# Patient Record
Sex: Female | Born: 1975
Health system: Southern US, Community
[De-identification: ages and names within clinical notes are randomized; demographics above are authoritative.]

## PROBLEM LIST (undated history)

## (undated) DIAGNOSIS — Z9981 Dependence on supplemental oxygen: Secondary | ICD-10-CM

## (undated) DIAGNOSIS — B379 Candidiasis, unspecified: Secondary | ICD-10-CM

## (undated) DIAGNOSIS — I499 Cardiac arrhythmia, unspecified: Secondary | ICD-10-CM

## (undated) DIAGNOSIS — G4733 Obstructive sleep apnea (adult) (pediatric): Secondary | ICD-10-CM

## (undated) DIAGNOSIS — I890 Lymphedema, not elsewhere classified: Secondary | ICD-10-CM

## (undated) DIAGNOSIS — R112 Nausea with vomiting, unspecified: Secondary | ICD-10-CM

## (undated) DIAGNOSIS — L0390 Cellulitis, unspecified: Secondary | ICD-10-CM

## (undated) DIAGNOSIS — Z9889 Other specified postprocedural states: Secondary | ICD-10-CM

## (undated) HISTORY — PX: DILATION AND CURETTAGE OF UTERUS: SHX78

---

## 2004-12-15 HISTORY — PX: HYSTEROSCOPY WITH D & C: SHX1775

## 2011-03-26 ENCOUNTER — Encounter: Payer: Managed Care, Other (non HMO) | Attending: Family Medicine | Admitting: *Deleted

## 2011-03-26 DIAGNOSIS — Z713 Dietary counseling and surveillance: Secondary | ICD-10-CM | POA: Insufficient documentation

## 2011-07-26 ENCOUNTER — Inpatient Hospital Stay (HOSPITAL_COMMUNITY)
Admission: EM | Admit: 2011-07-26 | Discharge: 2011-07-30 | DRG: 603 | Disposition: A | Discharge status: Home / Self Care | Payer: Managed Care, Other (non HMO) | Attending: Internal Medicine | Admitting: Internal Medicine

## 2011-07-26 ENCOUNTER — Emergency Department (HOSPITAL_COMMUNITY): Payer: Managed Care, Other (non HMO)

## 2011-07-26 DIAGNOSIS — I498 Other specified cardiac arrhythmias: Secondary | ICD-10-CM | POA: Diagnosis present

## 2011-07-26 DIAGNOSIS — G4733 Obstructive sleep apnea (adult) (pediatric): Secondary | ICD-10-CM | POA: Diagnosis present

## 2011-07-26 DIAGNOSIS — E559 Vitamin D deficiency, unspecified: Secondary | ICD-10-CM | POA: Diagnosis present

## 2011-07-26 DIAGNOSIS — E876 Hypokalemia: Secondary | ICD-10-CM | POA: Diagnosis not present

## 2011-07-26 DIAGNOSIS — L02419 Cutaneous abscess of limb, unspecified: Principal | ICD-10-CM | POA: Diagnosis present

## 2011-07-26 DIAGNOSIS — Z6841 Body Mass Index (BMI) 40.0 and over, adult: Secondary | ICD-10-CM

## 2011-07-26 DIAGNOSIS — J309 Allergic rhinitis, unspecified: Secondary | ICD-10-CM | POA: Diagnosis present

## 2011-07-26 DIAGNOSIS — I890 Lymphedema, not elsewhere classified: Secondary | ICD-10-CM | POA: Diagnosis present

## 2011-07-26 DIAGNOSIS — L03119 Cellulitis of unspecified part of limb: Principal | ICD-10-CM | POA: Diagnosis present

## 2011-07-26 LAB — BASIC METABOLIC PANEL
BUN: 9 mg/dL (ref 6–23)
CO2: 26 mEq/L (ref 19–32)
Calcium: 9.6 mg/dL (ref 8.4–10.5)
Chloride: 98 mEq/L (ref 96–112)
Creatinine, Ser: 0.57 mg/dL (ref 0.50–1.10)
GFR calc Af Amer: >60 mL/min (ref >60)
GFR calc non Af Amer: >60 mL/min (ref >60)
Glucose, Bld: 121 mg/dL — ABNORMAL HIGH (ref 70–99)
Potassium: 3.9 mEq/L (ref 3.5–5.1)
Sodium: 136 mEq/L (ref 135–145)

## 2011-07-26 LAB — CBC
HCT: 36.5 % (ref 36.0–46.0)
Hemoglobin: 12 g/dL (ref 12.0–15.0)
MCH: 26.7 pg (ref 26.0–34.0)
MCHC: 32.9 g/dL (ref 30.0–36.0)
MCV: 81.3 fL (ref 78.0–100.0)
Platelets: 254 10*3/uL (ref 150–400)
RBC: 4.49 MIL/uL (ref 3.87–5.11)
RDW: 16.6 % — ABNORMAL HIGH (ref 11.5–15.5)
WBC: 19.8 10*3/uL — ABNORMAL HIGH (ref 4.0–10.5)

## 2011-07-26 LAB — DIFFERENTIAL
Basophils Absolute: 0 10*3/uL (ref 0.0–0.1)
Basophils Relative: 0 % (ref 0–1)
Monocytes Absolute: 0.6 10*3/uL (ref 0.1–1.0)
Neutro Abs: 18.8 10*3/uL — ABNORMAL HIGH (ref 1.7–7.7)
Neutrophils Relative %: 95 % — ABNORMAL HIGH (ref 43–77)

## 2011-07-27 LAB — COMPREHENSIVE METABOLIC PANEL
ALT: 20 U/L (ref 0–35)
AST: 19 U/L (ref 0–37)
CO2: 25 mEq/L (ref 19–32)
Chloride: 100 mEq/L (ref 96–112)
GFR calc non Af Amer: >60 mL/min (ref >60)
Sodium: 135 mEq/L (ref 135–145)
Total Bilirubin: 0.6 mg/dL (ref 0.3–1.2)

## 2011-07-27 LAB — CBC
Platelets: 252 10*3/uL (ref 150–400)
RBC: 4.34 MIL/uL (ref 3.87–5.11)
WBC: 19.2 10*3/uL — ABNORMAL HIGH (ref 4.0–10.5)

## 2011-07-28 DIAGNOSIS — M79609 Pain in unspecified limb: Secondary | ICD-10-CM

## 2011-07-28 LAB — CBC
MCH: 25.8 pg — ABNORMAL LOW (ref 26.0–34.0)
MCHC: 31.8 g/dL (ref 30.0–36.0)
Platelets: 200 10*3/uL (ref 150–400)

## 2011-07-28 LAB — BASIC METABOLIC PANEL
Calcium: 8.6 mg/dL (ref 8.4–10.5)
GFR calc non Af Amer: >60 mL/min (ref >60)
Glucose, Bld: 116 mg/dL — ABNORMAL HIGH (ref 70–99)
Sodium: 135 mEq/L (ref 135–145)

## 2011-07-29 LAB — BASIC METABOLIC PANEL
BUN: 8 mg/dL (ref 6–23)
CO2: 24 mEq/L (ref 19–32)
Chloride: 104 mEq/L (ref 96–112)
Creatinine, Ser: 0.79 mg/dL (ref 0.50–1.10)
GFR calc Af Amer: >60 mL/min (ref >60)
Glucose, Bld: 119 mg/dL — ABNORMAL HIGH (ref 70–99)

## 2011-07-29 LAB — CBC
HCT: 32.4 % — ABNORMAL LOW (ref 36.0–46.0)
Hemoglobin: 10.2 g/dL — ABNORMAL LOW (ref 12.0–15.0)
MCV: 80.8 fL (ref 78.0–100.0)
RDW: 17.1 % — ABNORMAL HIGH (ref 11.5–15.5)
WBC: 6.5 10*3/uL (ref 4.0–10.5)

## 2011-08-02 LAB — CULTURE, BLOOD (ROUTINE X 2)

## 2011-08-03 NOTE — Discharge Summary (Signed)
  Amber, Kline        ACCOUNT NO.:  [id number]  MEDICAL RECORD NO.:  [id number]  LOCATION:  3702                         FACILITY:  [facility]  PHYSICIAN:  Conley Canal, MD      DATE OF BIRTH:  1976-12-13  DATE OF ADMISSION:  07/26/2011 DATE OF DISCHARGE:  07/30/2011                        DISCHARGE SUMMARY - REFERRING   PRIMARY CARE PHYSICIAN:  Dr. Arlys John.  DISCHARGE DIAGNOSES: 1. Severe right leg cellulitis in patient with chronic lymph edema. 2. Morbid obesity, BMI greater than 40. 3. Obstructive sleep apnea on CPAP. 4. Vitamin D deficiency. 5. Previous history of cellulitis. 6. Seasonal allergies.  DISCHARGE MEDICATIONS: 1. Doxycycline 100 mg twice daily for 6 more days. 2. Keflex 500 mg twice daily for 7 more days. 3. Vitamin D2 50,000 units every Sunday.  PROCEDURES PERFORMED:  X-rays of the tibial, femur, and hip on August 11 with no acute findings.  HOSPITAL COURSE:  [name] was admitted on August 11 with complaints of right leg pain and swelling.  The patient was found to have severe right leg cellulitis.  On the day of admission her white count was 19,000 with 95% neutrophils and a sedimentation rate was 35.  The patient was started on broad-spectrum antibiotics including Zosyn and vancomycin with improvement in a white count 6.5. Prior to discharge her electrolytes showed some hypokalemia, which was corrected.  She has done well.  She should complete another 7 days of antibiotics and follow with her primary care physician in the next 1 week.  She is discharged in stable condition.  The time spent for this discharge preparation is less than 30 minutes.     Conley Canal, MD     SR/MEDQ  D:  07/30/2011  T:  07/30/2011  Job:  [id number]  cc:   Dr. Arlys John  Electronically Signed by Conley Canal  on 08/03/2011 01:34:36 PM

## 2011-09-27 NOTE — H&P (Signed)
Amber Kline, Amber Kline        ACCOUNT NO.:  [id number]  MEDICAL RECORD NO.:  [id number]  LOCATION:  5023                         FACILITY:  [facility]  PHYSICIAN:  Lonia Blood, M.D.      DATE OF BIRTH:  Nov 07, 1976  DATE OF ADMISSION:  07/26/2011 DATE OF DISCHARGE:                             HISTORY & PHYSICAL   PRIMARY CARE PHYSICIAN:  She is unassigned to [place].  PRESENTING COMPLAINT:  Right leg pain and swelling.  HISTORY OF PRESENT ILLNESS:  The patient is a 35 year old morbidly obese female with known history of sleep apnea as well as prior cellulitis in her left leg.  She had cellulitis over 2 years ago in her left lower extremity, which seemed to have flared in April of this year and was treated.  She started having pain and swelling on her right leg which has turned red.  She came to the emergency room where she was seen and evaluated, seems to have a recurrence of cellulitis, this time on the right leg.  She has some fever at home, but denied diarrhea.  No constipation.  No nausea, vomiting or diarrhea.  PAST MEDICAL HISTORY:  Includes cellulitis, sleep apnea, morbid obesity, vitamin D deficiency.  ALLERGIES:  SEASONALE and SULFA.  MEDICATIONS:  Vitamin D2 50,000 units 1 tablet weekly.  SOCIAL HISTORY:  She is married and lives here in [place] with her husband.  She denied tobacco, alcohol or IV drug use.  FAMILY HISTORY:  No significant family history as far as she knows.  REVIEW OF SYSTEMS:  All systems reviewed are negative except per HPI.  PHYSICAL EXAMINATION:  VITAL SIGNS:  Temperature 98.7, her blood pressure 117/66, pulse 119, respiratory rate 18, sats 98% room air. GENERAL:  She is awake, alert, oriented, morbidly obese female, who is in no acute distress. HEENT:  PERRL.  EOMI.  No pallor, no jaundice.  No rhinorrhea. NECK:  Supple.  No visible JVD, no lymphadenopathy. RESPIRATORY:  Shows good air entry bilaterally.  No wheezes, no rales, no  crackles. CARDIOVASCULAR:  Shows S1, S2.  No murmur. ABDOMEN:  Obese, soft, nontender with positive bowel sounds. EXTREMITIES:  Right lower extremity is swollen, tender with some chronic stasis dermatitis.  It is red all the way to upper part of the thigh. Warm to touch. MUSCULOSKELETAL:  No joint swelling or tenderness.  LABORATORY DATA:  Sodium 136, potassium 3.9, chloride 98, CO2 26, glucose 121, BUN 9, creatinine 0.57, calcium 9.6.  White count is 19.8 with hemoglobin of 12, platelet count is 254 and left shift ANC of 18.8, sed rate is 35.  Right hip x-ray showed no acute bony abnormalities.  X- ray of the right femur showed no acute bony abnormalities.  X-ray of the right tibia and fibula also showed no acute bony abnormalities.  ASSESSMENT:  This is a 35 year old female presenting with right lower extremity pain, swelling, tenderness, pain is rated as 8/10.  She is currently stable and seems to have cellulitis.  PLAN: 1. Right lower extremity cellulitis, probably staph.  We cannot     completely rule out MRSA.  We will therefore admit the patient for     IV antibiotics.  I will start her on  vancomycin IV and transition     her to oral doxycycline or Septra.  We will get blood cultures, and     if we get any positive cultures, we will narrow down antibiotics. 2. Obstructive sleep apnea.  She is on BiPAP at home and we will     continue with that. 3. Morbid obesity.  The patient has been counseled and she probably     some Nutrition counseling. 4. Vitamin D deficiency.  She will continue on her weekly doses of     vitamin D. 5. Sinus tachycardia, probably from her infection.  We will keep a     watch on her, and if this persists, we will put her on tele     monitoring.  Probably with IV fluids, the patient should do much     better.     Lonia Blood, M.D.     Verlin Grills  D:  07/27/2011  T:  07/27/2011  Job:  [id number]  Electronically Signed by Lonia Blood M.D. on 09/27/2011  02:52:41 PM

## 2012-07-30 ENCOUNTER — Inpatient Hospital Stay (HOSPITAL_COMMUNITY)
Admission: EM | Admit: 2012-07-30 | Discharge: 2012-08-01 | DRG: 603 | Disposition: A | Discharge status: Home / Self Care | Payer: Managed Care, Other (non HMO) | Attending: Internal Medicine | Admitting: Internal Medicine

## 2012-07-30 ENCOUNTER — Encounter (HOSPITAL_COMMUNITY): Payer: Self-pay

## 2012-07-30 DIAGNOSIS — E46 Unspecified protein-calorie malnutrition: Secondary | ICD-10-CM | POA: Diagnosis present

## 2012-07-30 DIAGNOSIS — Z833 Family history of diabetes mellitus: Secondary | ICD-10-CM

## 2012-07-30 DIAGNOSIS — R000 Tachycardia, unspecified: Secondary | ICD-10-CM | POA: Diagnosis present

## 2012-07-30 DIAGNOSIS — R509 Fever, unspecified: Secondary | ICD-10-CM | POA: Diagnosis present

## 2012-07-30 DIAGNOSIS — Z9981 Dependence on supplemental oxygen: Secondary | ICD-10-CM

## 2012-07-30 DIAGNOSIS — R42 Dizziness and giddiness: Secondary | ICD-10-CM | POA: Diagnosis present

## 2012-07-30 DIAGNOSIS — R51 Headache: Secondary | ICD-10-CM | POA: Diagnosis not present

## 2012-07-30 DIAGNOSIS — L02419 Cutaneous abscess of limb, unspecified: Principal | ICD-10-CM | POA: Diagnosis present

## 2012-07-30 DIAGNOSIS — L0390 Cellulitis, unspecified: Secondary | ICD-10-CM

## 2012-07-30 DIAGNOSIS — R319 Hematuria, unspecified: Secondary | ICD-10-CM | POA: Diagnosis present

## 2012-07-30 DIAGNOSIS — R739 Hyperglycemia, unspecified: Secondary | ICD-10-CM | POA: Diagnosis present

## 2012-07-30 DIAGNOSIS — G4733 Obstructive sleep apnea (adult) (pediatric): Secondary | ICD-10-CM | POA: Diagnosis present

## 2012-07-30 DIAGNOSIS — R7309 Other abnormal glucose: Secondary | ICD-10-CM | POA: Diagnosis present

## 2012-07-30 DIAGNOSIS — E876 Hypokalemia: Secondary | ICD-10-CM | POA: Diagnosis present

## 2012-07-30 DIAGNOSIS — Z6841 Body Mass Index (BMI) 40.0 and over, adult: Secondary | ICD-10-CM

## 2012-07-30 DIAGNOSIS — I499 Cardiac arrhythmia, unspecified: Secondary | ICD-10-CM

## 2012-07-30 DIAGNOSIS — N926 Irregular menstruation, unspecified: Secondary | ICD-10-CM | POA: Diagnosis present

## 2012-07-30 DIAGNOSIS — D509 Iron deficiency anemia, unspecified: Secondary | ICD-10-CM | POA: Diagnosis present

## 2012-07-30 DIAGNOSIS — R519 Headache, unspecified: Secondary | ICD-10-CM | POA: Diagnosis not present

## 2012-07-30 DIAGNOSIS — L03119 Cellulitis of unspecified part of limb: Secondary | ICD-10-CM | POA: Diagnosis present

## 2012-07-30 DIAGNOSIS — I890 Lymphedema, not elsewhere classified: Secondary | ICD-10-CM | POA: Diagnosis present

## 2012-07-30 DIAGNOSIS — D72829 Elevated white blood cell count, unspecified: Secondary | ICD-10-CM | POA: Diagnosis present

## 2012-07-30 HISTORY — DX: Dependence on supplemental oxygen: Z99.81

## 2012-07-30 HISTORY — DX: Obstructive sleep apnea (adult) (pediatric): G47.33

## 2012-07-30 HISTORY — DX: Cardiac arrhythmia, unspecified: I49.9

## 2012-07-30 HISTORY — DX: Other specified postprocedural states: Z98.890

## 2012-07-30 HISTORY — DX: Cellulitis, unspecified: L03.90

## 2012-07-30 HISTORY — DX: Nausea with vomiting, unspecified: R11.2

## 2012-07-30 LAB — URINALYSIS, ROUTINE W REFLEX MICROSCOPIC
Bilirubin Urine: NEGATIVE
Glucose, UA: NEGATIVE mg/dL
Specific Gravity, Urine: 1.012 (ref 1.005–1.030)

## 2012-07-30 LAB — CBC WITH DIFFERENTIAL/PLATELET
Basophils Absolute: 0 10*3/uL (ref 0.0–0.1)
Basophils Relative: 0 % (ref 0–1)
Eosinophils Absolute: 0 10*3/uL (ref 0.0–0.7)
Eosinophils Relative: 0 % (ref 0–5)
HCT: 39.1 % (ref 36.0–46.0)
MCH: 25.7 pg — ABNORMAL LOW (ref 26.0–34.0)
MCHC: 32.2 g/dL (ref 30.0–36.0)
MCV: 79.6 fL (ref 78.0–100.0)
Monocytes Absolute: 0.7 10*3/uL (ref 0.1–1.0)
Monocytes Relative: 3 % (ref 3–12)
RDW: 16.1 % — ABNORMAL HIGH (ref 11.5–15.5)

## 2012-07-30 LAB — POCT I-STAT, CHEM 8
BUN: 13 mg/dL (ref 6–23)
Chloride: 100 mEq/L (ref 96–112)
Creatinine, Ser: 0.9 mg/dL (ref 0.50–1.10)
Glucose, Bld: 119 mg/dL — ABNORMAL HIGH (ref 70–99)
Potassium: 3.9 mEq/L (ref 3.5–5.1)
Sodium: 135 mEq/L (ref 135–145)

## 2012-07-30 LAB — URINE MICROSCOPIC-ADD ON

## 2012-07-30 MED ORDER — SODIUM CHLORIDE 0.9 % IV BOLUS (SEPSIS)
1000.0000 mL | Freq: Once | INTRAVENOUS | Status: AC
  Administered 2012-07-30: 1000 mL via INTRAVENOUS

## 2012-07-30 MED ORDER — PIPERACILLIN-TAZOBACTAM 3.375 G IVPB
3.3750 g | Freq: Once | INTRAVENOUS | Status: AC
  Administered 2012-07-30: 3.375 g via INTRAVENOUS
  Filled 2012-07-30 (×2): qty 50

## 2012-07-30 MED ORDER — VANCOMYCIN HCL 1000 MG IV SOLR
1500.0000 mg | Freq: Two times a day (BID) | INTRAVENOUS | Status: DC
  Administered 2012-07-31 – 2012-08-01 (×3): 1500 mg via INTRAVENOUS
  Filled 2012-07-30 (×5): qty 1500

## 2012-07-30 MED ORDER — ACETAMINOPHEN 325 MG PO TABS
ORAL_TABLET | ORAL | Status: AC
  Filled 2012-07-30: qty 2

## 2012-07-30 MED ORDER — HYDROCODONE-ACETAMINOPHEN 5-325 MG PO TABS
1.0000 | ORAL_TABLET | ORAL | Status: DC | PRN
Start: 2012-07-30 — End: 2012-07-30

## 2012-07-30 MED ORDER — ACETAMINOPHEN 325 MG PO TABS
325.0000 mg | ORAL_TABLET | Freq: Once | ORAL | Status: AC
  Administered 2012-07-30: 325 mg via ORAL

## 2012-07-30 MED ORDER — ENOXAPARIN SODIUM 40 MG/0.4ML ~~LOC~~ SOLN
40.0000 mg | Freq: Every day | SUBCUTANEOUS | Status: DC
  Administered 2012-07-30: 40 mg via SUBCUTANEOUS
  Filled 2012-07-30 (×2): qty 0.4

## 2012-07-30 MED ORDER — ACETAMINOPHEN 325 MG PO TABS
650.0000 mg | ORAL_TABLET | Freq: Four times a day (QID) | ORAL | Status: DC | PRN
  Administered 2012-07-31: 650 mg via ORAL
  Filled 2012-07-30: qty 2
  Filled 2012-07-30: qty 1

## 2012-07-30 MED ORDER — ONDANSETRON HCL 4 MG/2ML IJ SOLN: 4.0000 mg | Freq: Three times a day (TID) | INTRAMUSCULAR | Status: AC | PRN

## 2012-07-30 MED ORDER — OXYCODONE-ACETAMINOPHEN 5-325 MG PO TABS: 1.0000 | ORAL_TABLET | ORAL | Status: DC | PRN

## 2012-07-30 MED ORDER — FAMOTIDINE 40 MG PO TABS
40.0000 mg | ORAL_TABLET | Freq: Every day | ORAL | Status: DC
  Administered 2012-07-30 – 2012-07-31 (×2): 40 mg via ORAL
  Filled 2012-07-30 (×3): qty 1

## 2012-07-30 MED ORDER — SODIUM CHLORIDE 0.9 % IV SOLN
INTRAVENOUS | Status: DC
  Administered 2012-07-30: 15:00:00 via INTRAVENOUS

## 2012-07-30 MED ORDER — VANCOMYCIN HCL 1000 MG IV SOLR
1500.0000 mg | INTRAVENOUS | Status: AC
  Administered 2012-07-30: 1500 mg via INTRAVENOUS
  Filled 2012-07-30: qty 1500

## 2012-07-30 MED ORDER — ONDANSETRON HCL 4 MG PO TABS: 4.0000 mg | ORAL_TABLET | Freq: Four times a day (QID) | ORAL | Status: DC | PRN

## 2012-07-30 MED ORDER — SODIUM CHLORIDE 0.9 % IV SOLN
INTRAVENOUS | Status: DC
  Administered 2012-07-30 – 2012-07-31 (×2): via INTRAVENOUS

## 2012-07-30 MED ORDER — ACETAMINOPHEN 325 MG PO TABS
650.0000 mg | ORAL_TABLET | Freq: Once | ORAL | Status: AC
  Administered 2012-07-30: 650 mg via ORAL

## 2012-07-30 MED ORDER — VANCOMYCIN HCL IN DEXTROSE 1-5 GM/200ML-% IV SOLN
1000.0000 mg | Freq: Once | INTRAVENOUS | Status: AC
  Administered 2012-07-30: 1000 mg via INTRAVENOUS
  Filled 2012-07-30: qty 200

## 2012-07-30 MED ORDER — ONDANSETRON HCL 4 MG/2ML IJ SOLN: 4.0000 mg | Freq: Four times a day (QID) | INTRAMUSCULAR | Status: DC | PRN

## 2012-07-30 MED ORDER — HYDROMORPHONE HCL PF 1 MG/ML IJ SOLN: 1.0000 mg | INTRAMUSCULAR | Status: DC | PRN

## 2012-07-30 NOTE — ED Notes (Signed)
Wound culture collected by resident in CDU.

## 2012-07-30 NOTE — ED Notes (Signed)
Pt complains of cellulitis to right lower leg, hx of same. This episode strarted yesterdayafternoon.

## 2012-07-30 NOTE — ED Notes (Signed)
Patient unable to void at this time

## 2012-07-30 NOTE — ED Provider Notes (Signed)
History   This chart was scribed for Amber Kline B. Bernette Mayers, MD by Melba Coon. The patient was seen in room TR10C/TR10C and the patient's care was started at 1:47PM.    CSN: [id number]  Arrival date & time 07/30/12  1327   First MD Initiated Contact with Patient 07/30/12 1339      Chief Complaint  Patient presents with  . Cellulitis    (Consider location/radiation/quality/duration/timing/severity/associated sxs/prior treatment) The history is provided by the patient. No language interpreter was used.   Amber Kline is a 36 y.o. female who presents to the Emergency Department complaining of persistent, moderate to severe erythema to the right lower leg with an onset yesterday. Pt states that she has a Hx of similar symptoms last year; was admitted into the hospital for 4 days and, when released, was out of work for 3 weeks before the rash cleared up. Vancomycin and zosyn were given while in the hospital which alleviated the symptoms; pt was Rx Keflex after d/c which alleviated the symptoms. Fever and warmth to affected area present. No drainage present. No HA, neck pain, sore throat, rash, back pain, CP, SOB, abd pain, n/v/d, dysuria, or extremity edema, weakness, numbness, or tingling. No known allergies. No other pertinent medical symptoms.  PCP: [facility]; used to be Dr. Luz Brazen   Past Medical History  Diagnosis Date  . Cellulitis   . Sleep apnea     No past surgical history on file.  No family history on file.  History  Substance Use Topics  . Smoking status: Never Smoker   . Smokeless tobacco: Not on file  . Alcohol Use: No    OB History    Grav Para Term Preterm Abortions TAB SAB Ect Mult Living                  Review of Systems 10 Systems reviewed and all are negative for acute change except as noted in the HPI.   Allergies  Review of patient's allergies indicates no known allergies.  Home Medications  No current outpatient prescriptions on  file.  BP 139/105  Pulse 131  Temp 101 F (38.3 C) (Oral)  Resp 18  SpO2 99%  Physical Exam  Nursing note and vitals reviewed. Constitutional: She is oriented to person, place, and time. She appears well-developed and well-nourished.       Morbidly obese.  HENT:  Head: Normocephalic and atraumatic.  Eyes: EOM are normal. Pupils are equal, round, and reactive to light.  Neck: Normal range of motion. Neck supple.  Cardiovascular: Normal rate, normal heart sounds and intact distal pulses.   Pulmonary/Chest: Effort normal and breath sounds normal.  Abdominal: Bowel sounds are normal. She exhibits no distension. There is no tenderness.  Musculoskeletal: Normal range of motion. She exhibits no edema and no tenderness.  Neurological: She is alert and oriented to person, place, and time. She has normal strength. No cranial nerve deficit or sensory deficit.  Skin: Skin is warm and dry. No rash noted. There is erythema (erythema with qarmth on right medial leg from ankle to thigh).  Psychiatric: She has a normal mood and affect.    ED Course  Procedures (including critical care time)  DIAGNOSTIC STUDIES: Oxygen Saturation is 99% on room air, normal by my interpretation.    COORDINATION OF CARE:  1:52PM - pt will be given abx; advised to be admitted into hospital. Vanc, zosyn, tylenol, IV fludis will be ordered for the pt along with blood w/u.  Labs Reviewed  CBC WITH DIFFERENTIAL   No results found.   No diagnosis found.    MDM  Extensive cellulitis to R leg, associated with fever and tachycardia. Will move to CDU pending labs, plan admission for IV abx. Doubt she will be improved enough for discharge in 23hrs so not a good candidate for CDU Observation protocol.  Discussed with Fayrene Helper, PA in CDU.   I personally performed the services described in the documentation, which were scribed in my presence. The recorded information has been reviewed and considered.           Babette Stum B. Bernette Mayers, MD 07/30/12 1401

## 2012-07-30 NOTE — Progress Notes (Signed)
Patient placed on autoset CPAP at 2250 with a 2L oxygen bleed in per home regimen. Tolerating well at this time. SpO2 98% RT will continue to monitor.

## 2012-07-30 NOTE — Progress Notes (Signed)
ANTIBIOTIC CONSULT NOTE - INITIAL  Pharmacy Consult for Vancomycin Indication: cellulitis  No Known Allergies  Patient Measurements:   Weight ~ 430 lbs Height ~ 63 inches  Vital Signs: Temp: 101.1 F (38.4 C) (08/16 1610) Temp src: Axillary (08/16 1610) BP: 133/49 mmHg (08/16 1610) Pulse Rate: 106  (08/16 1500) Intake/Output from previous day:   Intake/Output from this shift:    Labs:  Basename 07/30/12 1429 07/30/12 1356  WBC -- 21.9*  HGB 12.9 12.6  PLT -- 256  LABCREA -- --  CREATININE 0.90 --   CrCl >100 ml/min  Microbiology: No results found for this or any previous visit (from the past 720 hour(s)).  Medical History: Past Medical History  Diagnosis Date  . Cellulitis   . Sleep apnea     Medications:  No prescriptions prior to admission   Received Vancomycin 1gm IV x 1 dose in ED today ~ 1500.   Assessment: 36 yo F presents to ED 07/30/2012 with RLE swelling and redness x 1 day.  Pt also reports pain and purulent drainage on her leg.  Pt has history of cellulitis RLE as well.  WBC and temp elevated, SCr WNL.  Obesity noted - will use obesity dosing nomogram.  Goal of Therapy:  Vancomycin trough level 10-15 mcg/ml  Plan:  Vancomycin 1500 mg IV x 1 now (to complete 2500 mg IV loading dose). Vancomycin 1500 mg IV Q12h - next dose due ~ 0600 07/31/12.   Will monitor renal function, culture data, and clinical progress. Check Vancomycin trough level when/if indicated.   [employer], Pharm.D., BCPS Clinical Pharmacist Pager [phone] 07/30/2012 4:30 PM

## 2012-07-30 NOTE — ED Notes (Signed)
Cellulitic area marked dated and timed.

## 2012-07-30 NOTE — H&P (Signed)
Hospital Admission Note Date: 07/30/2012  Patient name: Amber Kline Medical record number: [id number] Date of birth: 1976/10/13 Age: 36 y.o. Gender: female PCP: DEFAULT,PROVIDER, MD  Medical Service: Internal medicine teaching service  Attending physician: Dr. Margarito Liner    1st Contact: Dr. Shirlee Latch    Pager: [phone] 2nd Contact: Dr. Tonny Branch    Pager: [phone] After 5 pm or weekends: 1st Contact:      Pager: [phone] 2nd Contact:      Pager: [phone]  Chief Complaint: Right lower leg swelling/redness  History of Present Illness: Amber Kline is a 86 year woman with past medical history of sleep apnea on CPAP at night, lymphedema, cellulitis of her left leg (1 year ago which required 4 days of hospital admission for IV abx) presents to the ED for complaining of right lower extremity swelling and redness that started one day prior to admission. She rated her pain 8/10 in severity. She reports some purulent drainage on her leg. She does have a dog at home but denies any dog or cat bites.  She also reports fever and chills at home however she did not take her temperature but does feel lightheaded.  She also denies any nausea or vomiting, shortness of breath, chest pain.  Meds: No current outpatient prescriptions on file.  Allergies: Allergies as of 07/30/2012  . (No Known Allergies)   Past Medical History  Diagnosis Date  . Cellulitis   . Sleep apnea    Past surgical history: hysteroscopy and D&C for uterus polyps in 2006 . Family history : DM and heart disease in father Social History: she works in Paediatric nurse, married. Has [employer]. Does not smoke, drink alcohol or use illicit drugs  Review of Systems: Constitutional: +fever, + chills, diaphoresis, appetite change and fatigue.  HEENT: Denies photophobia, eye pain, redness, hearing loss, ear pain, congestion, sore throat, rhinorrhea, sneezing, mouth sores, trouble swallowing, neck pain, neck stiffness and  tinnitus.  Respiratory: Denies SOB, DOE, cough, chest tightness, and wheezing.  Cardiovascular: Denies chest pain, palpitations and leg swelling.  Gastrointestinal: Denies nausea, vomiting, abdominal pain, diarrhea, constipation, blood in stool and abdominal distention.  Genitourinary: Denies dysuria, urgency, frequency, hematuria, flank pain and difficulty urinating.  Musculoskeletal: Denies myalgias, back pain, joint swelling, arthralgias and gait problem.  Skin: Denies pallor,+ skin lesion on Right lower extremity.  Neurological: Denies dizziness, seizures, syncope, weakness, light-headedness, numbness and headaches.  Hematological: Denies adenopathy. Easy bruising, personal or family bleeding history  Psychiatric/Behavioral: Denies suicidal ideation, mood changes, confusion, nervousness, sleep disturbance and agitation  Physical Exam: Blood pressure 111/46, pulse 106, temperature 100.3 F (37.9 C), temperature source Oral, resp. rate 20, SpO2 100.00%. General: alert, morbidly obese, and cooperative to examination.  Head: normocephalic and atraumatic.  Eyes: vision grossly intact, pupils equal, pupils round, pupils reactive to light, no injection and anicteric.  Mouth: pharynx pink and moist, no erythema, and no exudates.  Neck: supple, full ROM, no thyromegaly, no JVD, and no carotid bruits.  Lungs: normal respiratory effort, no accessory muscle use, normal breath sounds, no crackles, and no wheezes. Heart: slightly tachycardic, regular rhythm, no murmur, no gallop, and no rub.  Abdomen: obese, soft, non-tender, normal bowel sounds, no distention, no guarding, no rebound tenderness, no hepatomegaly, and no splenomegaly.  Msk: no joint swelling, no joint warmth, and no redness over joints.  Pulses: 2+ DP/PT pulses bilaterally Extremities: No cyanosis, clubbing, nonpitting edema +4 bilaterally (likely lymphadema) Neurologic: alert & oriented X3, cranial nerves II-XII intact, strength  normal  in all extremities, sensation intact to light touch, and gait normal.  Skin: turgor normal.  Right LE: diffused erythema and tenderness and induration with a scattered nodules 0.5cm with scabbing and purulent discharge and as well as fluctuant  (I&D'ed). Psych: Oriented X3, memory intact for recent and remote, normally interactive, good eye contact, not anxious appearing, and not depressed appearing.  Lab results: Basic Metabolic Panel:  Basename 07/30/12 1429  NA 135  K 3.9  CL 100  CO2 --  GLUCOSE 119*  BUN 13  CREATININE 0.90  CALCIUM --  MG --  PHOS --   CBC:  Basename 07/30/12 1429 07/30/12 1356  WBC -- 21.9*  NEUTROABS -- 20.6*  HGB 12.9 12.6  HCT 38.0 39.1  MCV -- 79.6  PLT -- 256    Assessment & Plan by Problem:   1. Cellulitis: Clinical finding is consistent with cellulitis along with an elevated WBC of 21.9 and fever of 102.4 on admission. Patient has received 1 dose of Vancomycin and Zosyn and Tylenol in the ED. -Admit to medsurg floor -Continue IV Vancomycin for empiric treatment for MRSA and will narrow abx choice once we have wound culture results available -I&D performed and will send for wound culture -Send for blood culture -Pain control with percocet, if not controlled, will escalate to Morphine or Dilaudid -Follow CBC  2. Leukocyctosis: 2/2 infection, please see prob #1  3. Fever: 2/2 cellulitis.  Patient received IV abx prior to obtaining blood culture. Will continue IV vancomycin and Tylenol PRN for fever -Will obtain blood culture and urinalysis and awaiting for wound culture results  4. Sleep Apnea: Use CPAP at home, will continue while in-patient  5. Hyperglycemia: Glucose is slightly elevated at 119. Patient does not have a history of diabetes. Will screen with hemoglobin A1c or repeat another random glucose.  6. Tachycardia: likely 2/2 to her acute cellulitis. Will continue to monitor  7. Lightheadedness: unclear etiology, no sign of  bleeding. Hb is stable at 12.9.  Will check orthostatic vitals  DVT ppx: lovenox SQ  Signed: Karl Erway 07/30/2012, 3:54 PM

## 2012-07-31 DIAGNOSIS — L02419 Cutaneous abscess of limb, unspecified: Principal | ICD-10-CM | POA: Diagnosis present

## 2012-07-31 DIAGNOSIS — R519 Headache, unspecified: Secondary | ICD-10-CM | POA: Diagnosis not present

## 2012-07-31 DIAGNOSIS — I890 Lymphedema, not elsewhere classified: Secondary | ICD-10-CM | POA: Diagnosis present

## 2012-07-31 DIAGNOSIS — D509 Iron deficiency anemia, unspecified: Secondary | ICD-10-CM | POA: Diagnosis present

## 2012-07-31 DIAGNOSIS — R7309 Other abnormal glucose: Secondary | ICD-10-CM

## 2012-07-31 DIAGNOSIS — E46 Unspecified protein-calorie malnutrition: Secondary | ICD-10-CM | POA: Diagnosis present

## 2012-07-31 DIAGNOSIS — G4733 Obstructive sleep apnea (adult) (pediatric): Secondary | ICD-10-CM | POA: Diagnosis present

## 2012-07-31 DIAGNOSIS — N926 Irregular menstruation, unspecified: Secondary | ICD-10-CM | POA: Diagnosis present

## 2012-07-31 DIAGNOSIS — R319 Hematuria, unspecified: Secondary | ICD-10-CM | POA: Diagnosis present

## 2012-07-31 DIAGNOSIS — L03119 Cellulitis of unspecified part of limb: Secondary | ICD-10-CM | POA: Diagnosis present

## 2012-07-31 DIAGNOSIS — E876 Hypokalemia: Secondary | ICD-10-CM | POA: Diagnosis present

## 2012-07-31 DIAGNOSIS — R51 Headache: Secondary | ICD-10-CM | POA: Diagnosis not present

## 2012-07-31 LAB — CBC
HCT: 29.9 % — ABNORMAL LOW (ref 36.0–46.0)
Hemoglobin: 9.4 g/dL — ABNORMAL LOW (ref 12.0–15.0)
MCHC: 31.4 g/dL (ref 30.0–36.0)
MCV: 79.1 fL (ref 78.0–100.0)
WBC: 12.2 10*3/uL — ABNORMAL HIGH (ref 4.0–10.5)

## 2012-07-31 LAB — COMPREHENSIVE METABOLIC PANEL
ALT: 13 U/L (ref 0–35)
Albumin: 2.5 g/dL — ABNORMAL LOW (ref 3.5–5.2)
Alkaline Phosphatase: 43 U/L (ref 39–117)
Calcium: 8.2 mg/dL — ABNORMAL LOW (ref 8.4–10.5)
GFR calc Af Amer: >90 mL/min (ref >90)
Glucose, Bld: 112 mg/dL — ABNORMAL HIGH (ref 70–99)
Potassium: 3 mEq/L — ABNORMAL LOW (ref 3.5–5.1)
Sodium: 134 mEq/L — ABNORMAL LOW (ref 135–145)
Total Protein: 6.1 g/dL (ref 6.0–8.3)

## 2012-07-31 LAB — HEMOGLOBIN A1C
Hgb A1c MFr Bld: 5.6 % (ref <5.7)
Mean Plasma Glucose: 114 mg/dL (ref <117)

## 2012-07-31 MED ORDER — POTASSIUM CHLORIDE CRYS ER 20 MEQ PO TBCR
40.0000 meq | EXTENDED_RELEASE_TABLET | Freq: Once | ORAL | Status: AC
  Administered 2012-07-31: 40 meq via ORAL
  Filled 2012-07-31 (×2): qty 2

## 2012-07-31 MED ORDER — ACETAMINOPHEN 325 MG PO TABS: 650.0000 mg | ORAL_TABLET | Freq: Four times a day (QID) | ORAL | Status: DC | PRN

## 2012-07-31 MED ORDER — KETOROLAC TROMETHAMINE 30 MG/ML IJ SOLN
30.0000 mg | Freq: Once | INTRAMUSCULAR | Status: AC
  Administered 2012-07-31: 30 mg via INTRAVENOUS
  Filled 2012-07-31: qty 1

## 2012-07-31 MED ORDER — ENOXAPARIN SODIUM 100 MG/ML ~~LOC~~ SOLN
100.0000 mg | SUBCUTANEOUS | Status: DC
  Administered 2012-07-31: 100 mg via SUBCUTANEOUS
  Filled 2012-07-31 (×2): qty 1

## 2012-07-31 NOTE — H&P (Signed)
Internal Medicine Teaching Service Attending Note Date: 07/31/2012  Patient name: Amber Kline  Medical record number: [id number]  Date of birth: 12-25-1975   I have seen and evaluated [name] and discussed their care with the Residency Team. Please see Dr Christie Nottingham H&P for full details. Ms Leward Quan is a 36 yo female with OSA and obesity. She was hospitalized about one year who with R LE cellulitis and tx with IV Zosyn and Vanc and D/C'd on Doxy and Keflex. She has had two cellulitis episodes in the L leg that require outpt ABX only. Over the course of 24 hrs she rapidly developed pain in her R leg, warmth, swelling, drainage, subjective fever and chills, and lightheadedness. She presented to the emergency room and is found to be febrile with a temperature of 102.4 along with mild tachycardia. She was diagnosed with cellulitis and started on vancomycin Zosyn. The admitting team has continued on the vancomycin.  Past medical history significant only for morbid obesity, chronic lymphedema, history of cellulitis, obstructive sleep apnea, and history of vitamin D deficiency.   She takes no medications nor does she have any allergies.  She works at a desk job doing collections. After her last hospitalization for right lower extremity edema she was given a three-week work leave from her physician because she needed to keep her leg elevated.  Filed Vitals:   07/31/12 0902 07/31/12 0903 07/31/12 0904 07/31/12 1114  BP: 114/55 103/46 117/68 111/66  Pulse: 110   103  Temp: 98.2 F (36.8 C)   98.1 F (36.7 C)  TempSrc: Oral   Oral  Resp: 20   20  Height:      Weight:      SpO2: 100%   95%   GEN : NAD, pleasant HRRR ABD + BS, even in chest, NT, ND EXT : RLE warmth and erythema above knee. No sig improvement overnight. Non-pitting edema. Several firm nodules on L leg - pt states scarring from flea bits. Skin changes of CVI. Scar above L knee from dog bite as kid.  Labs and  imaging reviewed  Assessment and Plan: I agree with the formulated Assessment and Plan with the following changes:   1. RLE cellulitis - agree with Vanc. Temp and WBC show improvement. Once the pt shows some clinical improvement, can narrow abx using cx results if helpful.  2. OSA - cont CPAP 3. Obesity - best addressed in outpt setting with continuity. 4. Dizziness - Pt was orthostatic by pulse only. She has received IVF. Pt feels the dizziness is 2/2 lying in bed.  5. Tachycardia - Liley 2/2 infxn. Pt states this happens when she gets a cellulitis. She has been hydrated. Follow HR.  Burns Spain, MD 8/17/20131:13 PM

## 2012-07-31 NOTE — Plan of Care (Signed)
Problem: Limited Adherence to Nutrition-Related Recommendations (NB-1.6) Goal: Nutrition education Formal process to instruct or train a patient/client in a skill or to impart knowledge to help patients/clients voluntarily manage or modify food choices and eating behavior to maintain or improve health.  Outcome: Completed/Met Date Met:  07/31/12 RD consulted for diet education, pt is morbidly obese, Body mass index is 76.17 kg/(m^2). Pt has seen RD at out pt [facility]. Pt states that she was able to lose about 48 lbs in 1 year from making lifestyle changes, but has not been able to lose any more weight. RD provided pt with "Weight Loss Tips" hand out from [employer]. Pt provided food recall, RD gave ways to modify diet to encourage weight loss. Likely pt was not truthful about actual intake because based on her reported meals she would not be consuming enough kcal to maintain her current weight. Encouraged pt to keep food journal to track actual kcal consumed and aim for no less then 1500 kcal daily. Pt never followed up with RD at out pt clinic, encouraged pt to return if she desired help with losing weight. Overall pt seemed rather apathetic about losing weight.  Pt had no questions. RD reviewed chart and medications, no additional nutrition interventions warranted at this time. Please re-consult if needed.   Clarene Duke RD, LDN Pager [phone] After Hours pager [phone]

## 2012-07-31 NOTE — Progress Notes (Addendum)
Subjective: Pt c/o 7/10 right leg pain, c/o 8-9/10 frontal h/a.  She is tolerating diet; She denies n/v.   Objective: Vital signs in last 24 hours: Filed Vitals:   07/31/12 0902 07/31/12 0903 07/31/12 0904 07/31/12 1114  BP: 114/55 103/46 117/68 111/66  Pulse: 110   103  Temp: 98.2 F (36.8 C)   98.1 F (36.7 C)  TempSrc: Oral   Oral  Resp: 20   20  Height:      Weight:      SpO2: 100%   95%   Weight change:   Intake/Output Summary (Last 24 hours) at 07/31/12 1436 Last data filed at 07/31/12 1400  Gross per 24 hour  Intake  651.6 ml  Output      0 ml  Net  651.6 ml   Vitals reviewed. General: resting in bed, NAD; family at bedside, warm to touch, diaphoretic, morbidly obese HEENT: /AT, no scleral icterus Cardiac: tachycardic, no rubs, murmurs or gallops Pulm: clear to auscultation bilaterally, no wheezes, rales, or rhonchi Abd: soft, nontender, nondistended, BS present (normal) Ext: right lower ext erythema improved and erythema margins decreasing from original marks; right lower leg warmer than left; pustules to right lower leg; left leg no edema no erythema Neuro: alert and oriented X3  Lab Results: Basic Metabolic Panel:  Lab 07/31/12 1610 07/30/12 1429  NA 134* 135  K 3.0* 3.9  CL 101 100  CO2 24 --  GLUCOSE 112* 119*  BUN 9 13  CREATININE 0.65 0.90  CALCIUM 8.2* --  MG -- --  PHOS -- --   Liver Function Tests:  Lab 07/31/12 0510  AST 19  ALT 13  ALKPHOS 43  BILITOT 0.7  PROT 6.1  ALBUMIN 2.5*   CBC:  Lab 07/31/12 0510 07/30/12 1429 07/30/12 1356  WBC 12.2* -- 21.9*  NEUTROABS -- -- 20.6*  HGB 9.4* 12.9 --  HCT 29.9* 38.0 --  MCV 79.1 -- 79.6  PLT 199 -- 256   Hemoglobin A1C:  Lab 07/30/12 1818  HGBA1C 5.6   Fasting Lipid Panel: Pending lipid panel   Thyroid Function Tests: Pending tsh  Anemia Panel: Pending anemia panel   Urinalysis:  Lab 07/30/12 1734  COLORURINE YELLOW  LABSPEC 1.012  PHURINE 6.5  GLUCOSEU NEGATIVE    HGBUR MODERATE*  BILIRUBINUR NEGATIVE  KETONESUR NEGATIVE  PROTEINUR NEGATIVE  UROBILINOGEN 1.0  NITRITE NEGATIVE  LEUKOCYTESUR NEGATIVE   Misc. Labs: pending wound culture   Micro Results: Recent Results (from the past 240 hour(s))  WOUND CULTURE     Status: Normal (Preliminary result)   Collection Time   07/30/12  3:35 PM      Component Value Range Status Comment   Specimen Description WOUND RIGHT LEG   Final    Special Requests LOWER   Final    Gram Stain     Final    Value: NO WBC SEEN     RARE SQUAMOUS EPITHELIAL CELLS PRESENT     NO ORGANISMS SEEN   Culture NO GROWTH   Final    Report Status PENDING   Incomplete    Studies/Results: No results found. Medications:  Scheduled Meds:    . acetaminophen  325 mg Oral Once  . enoxaparin (LOVENOX) injection  100 mg Subcutaneous Q24H  . famotidine  40 mg Oral Daily  . ketorolac  30 mg Intravenous Once  . piperacillin-tazobactam (ZOSYN)  IV  3.375 g Intravenous Once  . potassium chloride  40 mEq Oral Once  .  sodium chloride  1,000 mL Intravenous Once  . vancomycin  1,500 mg Intravenous NOW  . vancomycin  1,500 mg Intravenous Q12H  . vancomycin  1,000 mg Intravenous Once  . DISCONTD: sodium chloride   Intravenous STAT  . DISCONTD: enoxaparin (LOVENOX) injection  40 mg Subcutaneous q1800   Continuous Infusions:    . sodium chloride 50 mL/hr at 07/31/12 0838   PRN Meds:.acetaminophen, ondansetron (ZOFRAN) IV, oxyCODONE-acetaminophen, DISCONTD: acetaminophen, DISCONTD: HYDROcodone-acetaminophen, DISCONTD:  HYDROmorphone (DILAUDID) injection, DISCONTD: ondansetron (ZOFRAN) IV, DISCONTD: ondansetron  Assessment/Plan:  36 y.o woman PMH cellulitis left leg presents with cellulitis to right lower extremity and fever.    1. Cellulitis:  -associated with pain, erythema, warmth, pustules  -SIRS criteria on admission leukocytosis, fever, tachycardia -wbc trending down, pt still intermittently febrile with tachycardia  -given  1 dose of Vancomycin and Zosyn and Tylenol in the ED.  -Continue IV Vancomycin (8/16) continued for empiric treatment for MRSA  -I&D performed right lower leg with pending wound culture  -pending blood cultures -Pain control with percocet or tylenol  2. Leukocyctosis:  -trending down  -2/2 infection, please see prob #1   3. Fever:  -T max this admission 102.4, intermittently spiking fevers  -prob 2/2 cellulitis -Of note patient received IV abx prior to obtaining blood culture.  -Will continue IV vancomycin and Tylenol PRN for fever  -pending wound, blood cultures,  urinalysis does not indicate infection  4. Tachycardia:  -likely 2/2 to her acute cellulitis. Will continue to monitor -EKG obtained   5. Sleep Apnea:  -ordered CPAP while in-patient and uses at home  6. Hyperglycemia:  -hemoglobin A1c 5.6% -does not indicate DM at this time   7. Lightheadedness:  -unclear etiology, no sign of bleeding.  -hbg on admission 12.6, H/H today 9.4/29.9 MCV 79.1 -orthostatic vitals negative   8. Anemia  -microcytic  -pending anemia panel   9. Hematuria -pt reports irregular menses   10. Morbid obesity  -BMI 76% -dietitian consult ordered   11. Headache -Rx Toradol 30 mg x 1   12.  DVT ppx -lovenox SQ  13. F/E/N -NS 75 cc/hr  -will monitor and replace electrolytes prn (K 40 meQ today) -reg diet     LOS: 1 day   Annett Gula [phone] 07/31/2012, 2:36 PM

## 2012-07-31 NOTE — Progress Notes (Signed)
PHARMACIST - PHYSICIAN COMMUNICATION Dr:  Internal Medicine Teaching Service CONCERNING:  Lovenox for DVT prophylaxis  DESCRIPTION:  In morbidly obese, medically-ill patients, use of weight-based enoxaparin dosed at 0.5 mg/kg once daily is feasible and results in peak anti-Xa levels within or near recommended range for thromboprophylaxis, without any evidence of excessive anti-Xa activity. These data suggest that this weight-based regimen may be more effective than standard fixed-dose enoxaparin.   Weight-based dosing of enoxaparin for VTE prophylaxis in morbidly obese, medically-Ill patients Molli Hazard T. Michelene Gardener, Jorje Guild [place] [employer] Research, Volume 125, Issue 3, March 2010, Pages 220-223, ISSN [id number], [email or web address].  Plan: Given this patients morbid obesity and no current bleeding issues, I have changed her dose to 100 mg daily starting this evening at [facility].   Please adjust if you feel that it is required for this patient.  Nadara Mustard, PharmD., MS Clinical Pharmacist Pager:  [phone]  Thank you for allowing pharmacy to be part of this patients care team.

## 2012-08-01 LAB — BASIC METABOLIC PANEL
GFR calc non Af Amer: >90 mL/min (ref >90)
Glucose, Bld: 106 mg/dL — ABNORMAL HIGH (ref 70–99)
Potassium: 3.4 mEq/L — ABNORMAL LOW (ref 3.5–5.1)
Sodium: 139 mEq/L (ref 135–145)

## 2012-08-01 LAB — CBC WITH DIFFERENTIAL/PLATELET
Eosinophils Absolute: 0.1 10*3/uL (ref 0.0–0.7)
Hemoglobin: 8.8 g/dL — ABNORMAL LOW (ref 12.0–15.0)
Lymphocytes Relative: 12 % (ref 12–46)
Lymphs Abs: 1 10*3/uL (ref 0.7–4.0)
MCH: 24.6 pg — ABNORMAL LOW (ref 26.0–34.0)
Monocytes Relative: 12 % (ref 3–12)
Neutro Abs: 6.1 10*3/uL (ref 1.7–7.7)
Neutrophils Relative %: 76 % (ref 43–77)
RBC: 3.57 MIL/uL — ABNORMAL LOW (ref 3.87–5.11)
WBC: 8.1 10*3/uL (ref 4.0–10.5)

## 2012-08-01 LAB — LIPID PANEL
HDL: 27 mg/dL — ABNORMAL LOW (ref >39)
LDL Cholesterol: 72 mg/dL (ref 0–99)
Total CHOL/HDL Ratio: 4.4 RATIO
Triglycerides: 96 mg/dL (ref <150)
VLDL: 19 mg/dL (ref 0–40)

## 2012-08-01 MED ORDER — NYSTATIN 100000 UNIT/GM EX POWD: Freq: Four times a day (QID) | CUTANEOUS | Status: AC

## 2012-08-01 MED ORDER — ACETAMINOPHEN 325 MG PO TABS: 650.0000 mg | ORAL_TABLET | Freq: Four times a day (QID) | ORAL | Status: AC | PRN

## 2012-08-01 MED ORDER — SULFAMETHOXAZOLE-TRIMETHOPRIM 800-160 MG PO TABS: 1.0000 | ORAL_TABLET | Freq: Two times a day (BID) | ORAL | Status: AC

## 2012-08-01 NOTE — Discharge Summary (Signed)
Internal Medicine Teaching [facility] Discharge Note  Name: Amber Kline MRN: [id number] DOB: 07-09-1976 36 y.o.  Date of Admission: 07/30/2012  1:42 PM Date of Discharge: 08/01/2012 Attending Physician: Farley Ly, MD  Discharge Diagnosis: 1. Cellulitis of the right lower leg 2. Obstructive Sleep apnea 3.  Hyperglycemia 4. Lightheadedness 5. Microcytic anemia 6. Morbid obesity  Discharge Medications: Medication List  As of 08/01/2012 11:47 AM   TAKE these medications         acetaminophen 325 MG tablet   Commonly known as: TYLENOL   Take 2 tablets (650 mg total) by mouth every 6 (six) hours as needed (h/a).      nystatin powder   Commonly known as: MYCOSTATIN   Apply topically 4 (four) times daily. To the underside of your breasts and skin folds.      sulfamethoxazole-trimethoprim 800-160 MG per tablet   Commonly known as: BACTRIM DS,SEPTRA DS   Take 1 tablet by mouth 2 (two) times daily.           Disposition and follow-up:   AmberTylee Orozco-Martinez was discharged from [facility] in Good condition.  She will call to make a follow up appointment with her PCP at [facility].  At the hospital follow up visit please address   1. Completion of her antibiotic regimen as well as the resolution of her cellulitis.   2. Anemia panel to work up the cause of her chronic microcytic anemia.  3.  Adherence to her CPAP.  4.  Follow up on weight loss consoling that was done while in patient including possible referral to bariatric surgery.   Follow-up Appointments: Follow-up Information    Follow up with [facility]. Call on 08/02/2012. (to make a follow up appointment by the middle to end of this coming week.)    Contact information:   [phone]         Discharge Orders    Future Orders Please Complete By Expires   Diet - low sodium heart healthy      Increase activity slowly      Discharge instructions        Comments:   1.  Start Bactrim DS tablets.  Take 1 tablet twice daily for the infection in your leg.  2.  Please call [facility] on Monday to make a follow up appointment after your hospitalization.     Consultations: Nutrition  Procedures Performed:  No results found.  Admission HPI: Mrs. Amber Kline is a 8 year woman with past medical history of sleep apnea on CPAP at night, lymphedema, cellulitis of her left leg (1 year ago which required 4 days of hospital admission for IV abx) presents to the ED for complaining of right lower extremity swelling and redness that started one day prior to admission. She rated her pain 8/10 in severity. She reports some purulent drainage on her leg. She does have a dog at home but denies any dog or cat bites. She also reports fever and chills at home however she did not take her temperature but does feel lightheaded. She also denies any nausea or vomiting, shortness of breath, chest pain.  Hospital Course by problem list: 1. Cellulitis: Amber Kline presented with redness, warmth, and pain to the right lower extremity associated with fever and leukocytosis. She is morbidly obese and has a history of cellulitis on her left lower leg. She was initially given Vanc and Zosyn in the ED for cellulitis and was  continued on Vancomycin IV during her stay. By day 2 following admission the redness and warmth had completely resolved and she was afebrile for 24 hours. She will be discharged on a weeks worth of Bactrim DS tablets 1 tablet BID. Blood cultures and wound culture from admission were NGTD on discharge.   2. Sleep Apnea: She has a significant history of OSA and is on home CPAP. She was continued on this as an inpatient and tolerated it well.    3. Hyperglycemia: On admission her blood sugars were increased. Her A1C was measured at 5.6% but can be falsely low in a patient with anemia.  She currently does not have diabetes and the initial  hyperglycemia was likely secondary to her infection. She will need continued follow up with her PCP.   4. Lightheadedness: On admission she was complaining of lightheadedness. Orthostatic blood pressures were negative on admission. She does have a microcytic anemia but denies any bleeding symptoms currently. She was mildly dehydrated on admit but this was corrected with IV fluids. She has been asymptomatic for 24 hours prior to discharge.   5. Anemia: On admission her Hgb was 12.6 with a MCV of 79.6. She had a previous baseline of about 10-11 in 2012 noted in the system. She trended downward with fluid hydration during her stay and had no bleeding noted. Anemia panel was pending at the time of discharge and she will need continued follow up with her PCP to follow her anemia.   6. Morbid obesity: Her BMI was 76 on this admission. It is likely the cause of many of her problems including her OSA, recurrent cellulitis, as well as her hyperglycemia. She was counseled on weight loss and congratulated on her previous weight loss. Nutrition was consulted and speak with her about dietary concerns and encouraged her to keep a food diary. We also discussed the possibility of bariatric surgery with her.   Discharge Vitals:  BP 105/51  Pulse 97  Temp 99.1 F (37.3 C) (Oral)  Resp 20  Ht 5\' 3"  (1.6 m)  Wt 430 lb (195.047 kg)  BMI 76.17 kg/m2  SpO2 100%  LMP 07/18/2012  Discharge Labs:  Results for orders placed during the hospital encounter of 07/30/12 (from the past 24 hour(s))  FERRITIN     Status: Normal   Collection Time   07/31/12  2:33 PM      Component Value Range   Ferritin 115  10 - 291 ng/mL  BASIC METABOLIC PANEL     Status: Abnormal   Collection Time   08/01/12  6:10 AM      Component Value Range   Sodium 139  135 - 145 mEq/L   Potassium 3.4 (*) 3.5 - 5.1 mEq/L   Chloride 106  96 - 112 mEq/L   CO2 22  19 - 32 mEq/L   Glucose, Bld 106 (*) 70 - 99 mg/dL   BUN 7  6 - 23 mg/dL    Creatinine, Ser 1.61  0.50 - 1.10 mg/dL   Calcium 8.5  8.4 - 09.6 mg/dL   GFR calc non Af Amer >90  >90 mL/min   GFR calc Af Amer >90  >90 mL/min  CBC WITH DIFFERENTIAL     Status: Abnormal   Collection Time   08/01/12  6:10 AM      Component Value Range   WBC 8.1  4.0 - 10.5 K/uL   RBC 3.57 (*) 3.87 - 5.11 MIL/uL   Hemoglobin 8.8 (*) 12.0 -  15.0 g/dL   HCT 16.1 (*) 09.6 - 04.5 %   MCV 79.6  78.0 - 100.0 fL   MCH 24.6 (*) 26.0 - 34.0 pg   MCHC 31.0  30.0 - 36.0 g/dL   RDW 40.9 (*) 81.1 - 91.4 %   Platelets 197  150 - 400 K/uL   Neutrophils Relative 76  43 - 77 %   Neutro Abs 6.1  1.7 - 7.7 K/uL   Lymphocytes Relative 12  12 - 46 %   Lymphs Abs 1.0  0.7 - 4.0 K/uL   Monocytes Relative 12  3 - 12 %   Monocytes Absolute 1.0  0.1 - 1.0 K/uL   Eosinophils Relative 1  0 - 5 %   Eosinophils Absolute 0.1  0.0 - 0.7 K/uL   Basophils Relative 0  0 - 1 %   Basophils Absolute 0.0  0.0 - 0.1 K/uL  LIPID PANEL     Status: Abnormal   Collection Time   08/01/12  6:10 AM      Component Value Range   Cholesterol 118  0 - 200 mg/dL   Triglycerides 96  <782 mg/dL   HDL 27 (*) >95 mg/dL   Total CHOL/HDL Ratio 4.4     VLDL 19  0 - 40 mg/dL   LDL Cholesterol 72  0 - 99 mg/dL   Signed: Dung Salinger 08/01/2012, 11:47 AM   Time Spent on Discharge: 40 min

## 2012-08-01 NOTE — Progress Notes (Signed)
Subjective: Doing remarkably better today.  She states that pain is gone and she was able to sleep well.  Eating and drinking well and has been up moving around the room.    Objective: Vital signs in last 24 hours: Filed Vitals:   07/31/12 1114 07/31/12 1500 07/31/12 2119 08/01/12 0542  BP: 111/66 114/55 106/58 105/51  Pulse: 103 113 99 97  Temp: 98.1 F (36.7 C) 98.2 F (36.8 C) 99.2 F (37.3 C) 99.1 F (37.3 C)  TempSrc: Oral Oral Oral Oral  Resp: 20 20 20 20   Height:      Weight:      SpO2: 95% 100% 91% 100%   Weight change:   Intake/Output Summary (Last 24 hours) at 08/01/12 1118 Last data filed at 07/31/12 1400  Gross per 24 hour  Intake    790 ml  Output      0 ml  Net    790 ml   Vitals reviewed. General: resting in bed, NAD HEENT: PERRL, EOMI, no scleral icterus Cardiac: RRR, no rubs, murmurs or gallops Pulm: clear to auscultation bilaterally, no wheezes, rales, or rhonchi Abd: soft, nontender, nondistended, BS present Ext: Right lower leg redness and erythema resolved well.  There is mild erythema beneath the pannus of the right knee with some moisture.   Neuro: alert and oriented X3, cranial nerves II-XII grossly intact, strength and sensation to light touch equal in bilateral upper and lower extremities  Lab Results: Basic Metabolic Panel:  Lab 08/01/12 3086 07/31/12 0510  NA 139 134*  K 3.4* 3.0*  CL 106 101  CO2 22 24  GLUCOSE 106* 112*  BUN 7 9  CREATININE 0.58 0.65  CALCIUM 8.5 8.2*  MG -- --  PHOS -- --   Liver Function Tests:  Lab 07/31/12 0510  AST 19  ALT 13  ALKPHOS 43  BILITOT 0.7  PROT 6.1  ALBUMIN 2.5*   CBC:  Lab 08/01/12 0610 07/31/12 0510 07/30/12 1356  WBC 8.1 12.2* --  NEUTROABS 6.1 -- 20.6*  HGB 8.8* 9.4* --  HCT 28.4* 29.9* --  MCV 79.6 79.1 --  PLT 197 199 --   Hemoglobin A1C:  Lab 07/30/12 1818  HGBA1C 5.6   Fasting Lipid Panel:  Lab 08/01/12 0610  CHOL 118  HDL 27*  LDLCALC 72  TRIG 96  CHOLHDL 4.4    LDLDIRECT --   Anemia Panel:  Lab 07/31/12 1433  VITAMINB12 --  FOLATE --  FERRITIN 115  TIBC --  IRON --  RETICCTPCT --   Urinalysis:  Lab 07/30/12 1734  COLORURINE YELLOW  LABSPEC 1.012  PHURINE 6.5  GLUCOSEU NEGATIVE  HGBUR MODERATE*  BILIRUBINUR NEGATIVE  KETONESUR NEGATIVE  PROTEINUR NEGATIVE  UROBILINOGEN 1.0  NITRITE NEGATIVE  LEUKOCYTESUR NEGATIVE   Micro Results: Recent Results (from the past 240 hour(s))  WOUND CULTURE     Status: Normal (Preliminary result)   Collection Time   07/30/12  3:35 PM      Component Value Range Status Comment   Specimen Description WOUND RIGHT LEG   Final    Special Requests LOWER   Final    Gram Stain     Final    Value: NO WBC SEEN     RARE SQUAMOUS EPITHELIAL CELLS PRESENT     NO ORGANISMS SEEN   Culture Culture reincubated for better growth   Final    Report Status PENDING   Incomplete   CULTURE, BLOOD (ROUTINE X 2)  Status: Normal (Preliminary result)   Collection Time   07/30/12  4:50 PM      Component Value Range Status Comment   Specimen Description BLOOD LEFT ARM   Final    Special Requests BOTTLES DRAWN AEROBIC AND ANAEROBIC 10CC   Final    Culture  Setup Time 07/31/2012 01:40   Final    Culture     Final    Value:        BLOOD CULTURE RECEIVED NO GROWTH TO DATE CULTURE WILL BE HELD FOR 5 DAYS BEFORE ISSUING A FINAL NEGATIVE REPORT   Report Status PENDING   Incomplete   CULTURE, BLOOD (ROUTINE X 2)     Status: Normal (Preliminary result)   Collection Time   07/30/12  6:25 PM      Component Value Range Status Comment   Specimen Description BLOOD LEFT HAND   Final    Special Requests BOTTLES DRAWN AEROBIC AND ANAEROBIC 10CC   Final    Culture  Setup Time 07/31/2012 01:40   Final    Culture     Final    Value:        BLOOD CULTURE RECEIVED NO GROWTH TO DATE CULTURE WILL BE HELD FOR 5 DAYS BEFORE ISSUING A FINAL NEGATIVE REPORT   Report Status PENDING   Incomplete    Medications: I have reviewed the patient's  current medications. Scheduled Meds:   . enoxaparin (LOVENOX) injection  100 mg Subcutaneous Q24H  . famotidine  40 mg Oral Daily  . ketorolac  30 mg Intravenous Once  . vancomycin  1,500 mg Intravenous Q12H   Continuous Infusions:   . sodium chloride 75 mL/hr at 07/31/12 1845   PRN Meds:.acetaminophen, oxyCODONE-acetaminophen, DISCONTD: acetaminophen  Assessment/Plan: 36 y.o woman PMH cellulitis left leg presents with cellulitis to right lower extremity and fever.   1. Cellulitis: Amber Kline presented with redness, warmth, and pain to the right lower extremity associated with fever and leukocytosis.  She is morbidly obese and has a history of cellulitis on her left lower leg.  She was initially given Vanc and Zosyn in the ED for cellulitis and was continued on Vancomycin IV during her stay.  By day 2 following admission the redness and warmth had completely resolved and she was afebrile for 24 hours.  She will be discharged on a weeks worth of Bactrim DS tablets 1 tablet BID.  Blood cultures and wound culture from admission were NGTD on discharge.   2. Sleep Apnea: She has a significant history of OSA and is on home CPAP.  She was continued on this as an inpatient and tolerated it well.   3. Hyperglycemia: ON admission her blood sugars were increased.  Her A1C was measured at 5.6 which does not indicate diabetes and the initial hyperglycemia was likely secondary to her infection.  She will need continued follow up with her PCP.   4. Lightheadedness: On admission she was complaining of lightheadedness. Orthostatic blood pressures were negative on admission.  She does have a microcytic anemia but denies any bleeding symptoms currently.  She was mildly dehydrated on admit but this was corrected with IV fluids.  She has been asymptomatic for 24 hours prior to discharge.   5. Anemia:  On admission her Hgb was 12.6 with a MCV of 79.6.  She had a previous baseline of about 10-11 in 2012  noted in the system.  She trended downward with fluid hydration during her stay and had no bleeding noted.  Anemia panel was pending at the time of discharge and she will need continued follow up with her PCP to follow her anemia.    6. Morbid obesity: Her BMI was 76 on this admission.  It is likely the cause of many of her problems including her OSA, recurrent cellulitis, as well as her hyperglycemia.  She was counseled on weight loss and congratulated on her previous weight loss.  Nutrition was consulted and speak with her about dietary concerns and encouraged her to keep a food diary.  We also discussed the possibility of bariatric surgery with her.   7. DVT ppx  -lovenox SQ   8.  Dispo: Home today with 7 day course of Bactrim.  Follow up with [facility].    LOS: 2 days   Amber Kline 08/01/2012, 11:18 AM

## 2012-08-03 LAB — WOUND CULTURE

## 2012-08-06 LAB — CULTURE, BLOOD (ROUTINE X 2)
Culture: NO GROWTH
Culture: NO GROWTH

## 2012-10-10 ENCOUNTER — Encounter (HOSPITAL_COMMUNITY): Payer: Self-pay

## 2012-10-10 ENCOUNTER — Emergency Department (INDEPENDENT_AMBULATORY_CARE_PROVIDER_SITE_OTHER)
Admission: EM | Admit: 2012-10-10 | Discharge: 2012-10-10 | Disposition: A | Discharge status: Home / Self Care | Payer: Managed Care, Other (non HMO) | Source: Home / Self Care | Attending: Family Medicine | Admitting: Family Medicine

## 2012-10-10 ENCOUNTER — Emergency Department (INDEPENDENT_AMBULATORY_CARE_PROVIDER_SITE_OTHER): Payer: Managed Care, Other (non HMO)

## 2012-10-10 DIAGNOSIS — M25569 Pain in unspecified knee: Secondary | ICD-10-CM

## 2012-10-10 DIAGNOSIS — M25561 Pain in right knee: Secondary | ICD-10-CM

## 2012-10-10 MED ORDER — IBUPROFEN 800 MG PO TABS
ORAL_TABLET | ORAL | Status: AC
  Filled 2012-10-10: qty 1

## 2012-10-10 MED ORDER — TRAMADOL HCL 50 MG PO TABS: 50.0000 mg | ORAL_TABLET | Freq: Four times a day (QID) | ORAL | Status: DC | PRN

## 2012-10-10 MED ORDER — IBUPROFEN 800 MG PO TABS
800.0000 mg | ORAL_TABLET | Freq: Once | ORAL | Status: AC
  Administered 2012-10-10: 800 mg via ORAL

## 2012-10-10 MED ORDER — IBUPROFEN 800 MG PO TABS: 800.0000 mg | ORAL_TABLET | Freq: Three times a day (TID) | ORAL | Status: DC | PRN

## 2012-10-10 NOTE — ED Notes (Signed)
No known injury; c/o 2 week duration of right knee pain. Has used tylenol  2 pills x 1 dose w no relief of pain

## 2012-10-10 NOTE — ED Provider Notes (Signed)
History     CSN: [id number]  Arrival date & time 10/10/12  1128   First MD Initiated Contact with Patient 10/10/12 1130      Chief Complaint  Patient presents with  . Knee Pain    (Consider location/radiation/quality/duration/timing/severity/associated sxs/prior treatment) HPI Comments: 36 year old female with history of morbid obesity. Here complaining of right knee pain for about 2 weeks. Patient reports that she remembers having a slight slippage in the bathtub few weeks ago before her pain started. Reports her pain is diffused but mostly in the anterior lower area and at the back of her knee. States her pain is getting worse. Only improve with keeping weight from her knee and keep it elevated. Pain worse when walking. She works in a clerical position sitting on a desk. Denies skin redness, swelling or pain. Denies fever or chills. She was admitted in the hospital last time in August for right lower leg cellulitis.  Patient only takes Tylenol inconsistently for pain has not taken any medications for pain today. Patient reports being afraid of taking narcotics as she has family history of addictions. Patient reports she has had problems with knee pain in the past but has never seen orthopedics. She is in the process of getting a referral for bariatric surgery to her primary care provider.   Past Medical History  Diagnosis Date  . Cellulitis ~ 2005; 03/2011; 07/2011; 07/30/12    LLE: LLE: RLE/pt; RLE "both times I have been hospitalized it's been for the right leg"  . Dysrhythmia 07/30/2012    tachycardia  . PONV (postoperative nausea and vomiting)   . On home O2 07/30/2012    "sleep w/2L @ night"  . OSA (obstructive sleep apnea)     "severe; sleep w/oxygen and BiPAP"    Past Surgical History  Procedure Date  . Hysteroscopy w/d&c 2006    for uterine polyp    History reviewed. No pertinent family history.  History  Substance Use Topics  . Smoking status: Former Smoker -- 4 years     Types: Cigarettes    Quit date: 08/15/2009  . Smokeless tobacco: Never Used   Comment: 07/30/2012 "only smoked 3-4 cigarettes on the weekends when I was smoking"  . Alcohol Use: Yes     07/30/2012 "have drank; never frequent; last drink ~ 2001"    OB History    Grav Para Term Preterm Abortions TAB SAB Ect Mult Living                  Review of Systems  Constitutional: Negative for fever and chills.  Gastrointestinal: Negative for nausea, vomiting, abdominal pain and diarrhea.  Genitourinary: Negative for dysuria, frequency, hematuria and flank pain.  Musculoskeletal:       As per history of present illness  Skin: Negative for rash.  Neurological: Negative for dizziness and headaches.  All other systems reviewed and are negative.    Allergies  Review of patient's allergies indicates no known allergies.  Home Medications   Current Outpatient Rx  Name Route Sig Dispense Refill  . ACETAMINOPHEN 325 MG PO TABS Oral Take 2 tablets (650 mg total) by mouth every 6 (six) hours as needed (h/a).    . IBUPROFEN 800 MG PO TABS Oral Take 1 tablet (800 mg total) by mouth every 8 (eight) hours as needed for pain. 21 tablet 0  . NYSTATIN 100000 UNIT/GM EX POWD Topical Apply topically 4 (four) times daily. To the underside of your breasts and skin folds.  60 g 0  . TRAMADOL HCL 50 MG PO TABS Oral Take 1 tablet (50 mg total) by mouth every 6 (six) hours as needed for pain. 15 tablet 0    BP 136/57  Pulse 100  Temp 98 F (36.7 C) (Oral)  Resp 22  SpO2 98%  LMP 08/09/2012  Physical Exam  Nursing note and vitals reviewed. Constitutional: She is oriented to person, place, and time.       Morbidly obese. Tearful due to pain.  Cardiovascular: Normal heart sounds.   Pulmonary/Chest: Breath sounds normal.  Musculoskeletal:       Right knee: morbidly obese. No obvious swelling. No erythema. Pain with palpation inferior and lateral to the patella. Able to put wait and walk to xrays with  significant discomfort.  Neurological: She is alert and oriented to person, place, and time.  Skin:       Right lower extremities: No skin erythema, induration, or fluctuation, swelling or tenderness. No skin breaks, pustules or abrasions.    ED Course  Procedures (including critical care time)  Labs Reviewed - No data to display Dg Knee 1-2 Views Right  10/10/2012  *RADIOLOGY REPORT*  Clinical Data: Right knee pain.  RIGHT KNEE - 1-2 VIEW  Comparison: 07/26/2011  Findings: No evidence of acute fracture, subluxation or dislocation identified.  No radio-opaque foreign bodies are present.  No focal bony lesions are noted.  The joint spaces are unremarkable except for mild tricompartmental joint space narrowing.  IMPRESSION: No evidence of acute abnormality.  Mild tricompartmental degenerative changes.   Original Report Authenticated By: Rosendo Gros, M.D.      1. Right knee pain       MDM  Morbidly obese female (430lbs) with right knee pain for 2 weeks. No obvious effusion. Degenerative changes on x-rays. Difficult exam due to body habitus possible osteoarthritis vs internal derangement. Prescribed ibuprofen and tramadol. Asked to follow up with orthopedist or primary care provider if no improvement of her symptoms. As she will likely require an MRI.         Sharin Grave, MD 10/11/12 [phone]

## 2015-03-04 ENCOUNTER — Encounter (HOSPITAL_COMMUNITY): Payer: Self-pay | Admitting: Emergency Medicine

## 2015-03-04 ENCOUNTER — Emergency Department (HOSPITAL_COMMUNITY): Payer: Managed Care, Other (non HMO)

## 2015-03-04 ENCOUNTER — Inpatient Hospital Stay (HOSPITAL_COMMUNITY)
Admission: EM | Admit: 2015-03-04 | Discharge: 2015-03-07 | DRG: 872 | Disposition: A | Discharge status: Home / Self Care | Payer: Managed Care, Other (non HMO) | Attending: Internal Medicine | Admitting: Internal Medicine

## 2015-03-04 DIAGNOSIS — R7989 Other specified abnormal findings of blood chemistry: Secondary | ICD-10-CM

## 2015-03-04 DIAGNOSIS — Z9981 Dependence on supplemental oxygen: Secondary | ICD-10-CM

## 2015-03-04 DIAGNOSIS — L03116 Cellulitis of left lower limb: Secondary | ICD-10-CM

## 2015-03-04 DIAGNOSIS — E1165 Type 2 diabetes mellitus with hyperglycemia: Secondary | ICD-10-CM | POA: Diagnosis present

## 2015-03-04 DIAGNOSIS — Z7401 Bed confinement status: Secondary | ICD-10-CM

## 2015-03-04 DIAGNOSIS — E872 Acidosis: Secondary | ICD-10-CM

## 2015-03-04 DIAGNOSIS — Z6841 Body Mass Index (BMI) 40.0 and over, adult: Secondary | ICD-10-CM

## 2015-03-04 DIAGNOSIS — G4733 Obstructive sleep apnea (adult) (pediatric): Secondary | ICD-10-CM | POA: Diagnosis present

## 2015-03-04 DIAGNOSIS — B9689 Other specified bacterial agents as the cause of diseases classified elsewhere: Secondary | ICD-10-CM | POA: Diagnosis not present

## 2015-03-04 DIAGNOSIS — L03115 Cellulitis of right lower limb: Secondary | ICD-10-CM | POA: Diagnosis present

## 2015-03-04 DIAGNOSIS — R739 Hyperglycemia, unspecified: Secondary | ICD-10-CM | POA: Diagnosis present

## 2015-03-04 DIAGNOSIS — Z87891 Personal history of nicotine dependence: Secondary | ICD-10-CM

## 2015-03-04 DIAGNOSIS — A419 Sepsis, unspecified organism: Secondary | ICD-10-CM | POA: Diagnosis present

## 2015-03-04 DIAGNOSIS — Z882 Allergy status to sulfonamides status: Secondary | ICD-10-CM | POA: Diagnosis not present

## 2015-03-04 DIAGNOSIS — E876 Hypokalemia: Secondary | ICD-10-CM | POA: Diagnosis present

## 2015-03-04 LAB — I-STAT CG4 LACTIC ACID, ED: LACTIC ACID, VENOUS: 3.91 mmol/L — AB (ref 0.5–2.0)

## 2015-03-04 LAB — CBC WITH DIFFERENTIAL/PLATELET
BASOS ABS: 0 10*3/uL (ref 0.0–0.1)
Basophils Relative: 0 % (ref 0–1)
EOS PCT: 0 % (ref 0–5)
Eosinophils Absolute: 0 10*3/uL (ref 0.0–0.7)
HCT: 44.1 % (ref 36.0–46.0)
HEMOGLOBIN: 14.6 g/dL (ref 12.0–15.0)
LYMPHS PCT: 3 % — AB (ref 12–46)
Lymphs Abs: 0.3 10*3/uL — ABNORMAL LOW (ref 0.7–4.0)
MCH: 30.8 pg (ref 26.0–34.0)
MCHC: 33.1 g/dL (ref 30.0–36.0)
MCV: 93 fL (ref 78.0–100.0)
MONO ABS: 0.3 10*3/uL (ref 0.1–1.0)
MONOS PCT: 3 % (ref 3–12)
Neutro Abs: 9 10*3/uL — ABNORMAL HIGH (ref 1.7–7.7)
Neutrophils Relative %: 94 % — ABNORMAL HIGH (ref 43–77)
PLATELETS: 189 10*3/uL (ref 150–400)
RBC: 4.74 MIL/uL (ref 3.87–5.11)
RDW: 13.9 % (ref 11.5–15.5)
WBC: 9.6 10*3/uL (ref 4.0–10.5)

## 2015-03-04 LAB — I-STAT CHEM 8, ED
BUN: 16 mg/dL (ref 6–23)
CALCIUM ION: 1.02 mmol/L — AB (ref 1.12–1.23)
CHLORIDE: 101 mmol/L (ref 96–112)
Creatinine, Ser: 0.7 mg/dL (ref 0.50–1.10)
GLUCOSE: 136 mg/dL — AB (ref 70–99)
HEMATOCRIT: 46 % (ref 36.0–46.0)
Hemoglobin: 15.6 g/dL — ABNORMAL HIGH (ref 12.0–15.0)
Potassium: 4.6 mmol/L (ref 3.5–5.1)
SODIUM: 136 mmol/L (ref 135–145)
TCO2: 21 mmol/L (ref 0–100)

## 2015-03-04 LAB — COMPREHENSIVE METABOLIC PANEL
ALK PHOS: 46 U/L (ref 39–117)
ALT: 53 U/L — ABNORMAL HIGH (ref 0–35)
AST: 76 U/L — AB (ref 0–37)
Albumin: 3.8 g/dL (ref 3.5–5.2)
Anion gap: 13 (ref 5–15)
BILIRUBIN TOTAL: 1.6 mg/dL — AB (ref 0.3–1.2)
BUN: 11 mg/dL (ref 6–23)
CALCIUM: 8.9 mg/dL (ref 8.4–10.5)
CHLORIDE: 100 mmol/L (ref 96–112)
CO2: 21 mmol/L (ref 19–32)
Creatinine, Ser: 0.8 mg/dL (ref 0.50–1.10)
GFR calc non Af Amer: >90 mL/min (ref >90)
GLUCOSE: 119 mg/dL — AB (ref 70–99)
Potassium: 5.1 mmol/L (ref 3.5–5.1)
SODIUM: 134 mmol/L — AB (ref 135–145)
TOTAL PROTEIN: 7.6 g/dL (ref 6.0–8.3)

## 2015-03-04 LAB — LACTIC ACID, PLASMA: Lactic Acid, Venous: 2.4 mmol/L (ref 0.5–2.0)

## 2015-03-04 MED ORDER — SODIUM CHLORIDE 0.9 % IV SOLN
INTRAVENOUS | Status: DC
  Administered 2015-03-04: 150 mL/h via INTRAVENOUS

## 2015-03-04 MED ORDER — SODIUM CHLORIDE 0.9 % IV BOLUS (SEPSIS)
1000.0000 mL | Freq: Once | INTRAVENOUS | Status: AC
  Administered 2015-03-04: 1000 mL via INTRAVENOUS

## 2015-03-04 MED ORDER — ACETAMINOPHEN 500 MG PO TABS
1000.0000 mg | ORAL_TABLET | Freq: Once | ORAL | Status: AC
Start: 2015-03-04 — End: 2015-03-04
  Administered 2015-03-04: 1000 mg via ORAL
  Filled 2015-03-04: qty 2

## 2015-03-04 MED ORDER — ONDANSETRON HCL 4 MG/2ML IJ SOLN
4.0000 mg | Freq: Four times a day (QID) | INTRAMUSCULAR | Status: DC | PRN
Start: 2015-03-04 — End: 2015-03-07

## 2015-03-04 MED ORDER — DIPHENHYDRAMINE HCL 50 MG/ML IJ SOLN
INTRAMUSCULAR | Status: AC
Start: 2015-03-04 — End: 2015-03-04
  Administered 2015-03-04: 25 mg via INTRAVENOUS
  Filled 2015-03-04: qty 1

## 2015-03-04 MED ORDER — VANCOMYCIN HCL 10 G IV SOLR
1500.0000 mg | Freq: Three times a day (TID) | INTRAVENOUS | Status: DC
  Administered 2015-03-05 – 2015-03-07 (×7): 1500 mg via INTRAVENOUS
  Filled 2015-03-04 (×11): qty 1500

## 2015-03-04 MED ORDER — SODIUM CHLORIDE 0.9 % IV BOLUS (SEPSIS): 1000.0000 mL | Freq: Once | INTRAVENOUS | Status: DC

## 2015-03-04 MED ORDER — VANCOMYCIN HCL IN DEXTROSE 1-5 GM/200ML-% IV SOLN
1000.0000 mg | Freq: Once | INTRAVENOUS | Status: DC
  Administered 2015-03-04: 1000 mg via INTRAVENOUS
  Filled 2015-03-04: qty 200

## 2015-03-04 MED ORDER — DIPHENHYDRAMINE HCL 50 MG/ML IJ SOLN
25.0000 mg | Freq: Once | INTRAMUSCULAR | Status: AC
  Administered 2015-03-04: 25 mg via INTRAVENOUS

## 2015-03-04 MED ORDER — SODIUM CHLORIDE 0.9 % IV SOLN
INTRAVENOUS | Status: DC
  Administered 2015-03-04: 100 mL/h via INTRAVENOUS

## 2015-03-04 MED ORDER — PIPERACILLIN-TAZOBACTAM 3.375 G IVPB 30 MIN
3.3750 g | Freq: Once | INTRAVENOUS | Status: AC
  Administered 2015-03-04: 3.375 g via INTRAVENOUS
  Filled 2015-03-04: qty 50

## 2015-03-04 MED ORDER — ONDANSETRON HCL 4 MG/2ML IJ SOLN
4.0000 mg | Freq: Three times a day (TID) | INTRAMUSCULAR | Status: DC | PRN
Start: 2015-03-04 — End: 2015-03-05

## 2015-03-04 MED ORDER — ENOXAPARIN SODIUM 40 MG/0.4ML ~~LOC~~ SOLN
40.0000 mg | SUBCUTANEOUS | Status: DC
  Administered 2015-03-04: 40 mg via SUBCUTANEOUS
  Filled 2015-03-04 (×2): qty 0.4

## 2015-03-04 MED ORDER — DIPHENHYDRAMINE HCL 50 MG/ML IJ SOLN
25.0000 mg | Freq: Three times a day (TID) | INTRAMUSCULAR | Status: DC
  Administered 2015-03-05 – 2015-03-07 (×7): 25 mg via INTRAVENOUS
  Filled 2015-03-04: qty 1
  Filled 2015-03-04: qty 0.5
  Filled 2015-03-04: qty 1
  Filled 2015-03-04: qty 0.5
  Filled 2015-03-04: qty 1
  Filled 2015-03-04: qty 0.5
  Filled 2015-03-04: qty 1
  Filled 2015-03-04 (×2): qty 0.5
  Filled 2015-03-04: qty 1
  Filled 2015-03-04: qty 0.5
  Filled 2015-03-04: qty 1

## 2015-03-04 MED ORDER — HYDROCODONE-ACETAMINOPHEN 5-325 MG PO TABS
1.0000 | ORAL_TABLET | ORAL | Status: DC | PRN
  Administered 2015-03-04 – 2015-03-06 (×6): 2 via ORAL
  Filled 2015-03-04 (×6): qty 2

## 2015-03-04 MED ORDER — SODIUM CHLORIDE 0.9 % IJ SOLN
3.0000 mL | Freq: Two times a day (BID) | INTRAMUSCULAR | Status: DC
  Administered 2015-03-04 – 2015-03-07 (×4): 3 mL via INTRAVENOUS

## 2015-03-04 MED ORDER — ONDANSETRON HCL 4 MG PO TABS: 4.0000 mg | ORAL_TABLET | Freq: Four times a day (QID) | ORAL | Status: DC | PRN

## 2015-03-04 MED ORDER — PIPERACILLIN-TAZOBACTAM 3.375 G IVPB
3.3750 g | Freq: Three times a day (TID) | INTRAVENOUS | Status: DC
  Administered 2015-03-05 – 2015-03-06 (×4): 3.375 g via INTRAVENOUS
  Filled 2015-03-04 (×6): qty 50

## 2015-03-04 NOTE — ED Notes (Signed)
Pt c/o cellulitis to right leg. Pt has history of same. Pt presents with redness to right leg.

## 2015-03-04 NOTE — ED Notes (Signed)
Dr.Miller shown results of Lactic Acid POC .

## 2015-03-04 NOTE — H&P (Signed)
Date: 03/04/2015               Patient Name:  Amber Kline MRN: [id number]  DOB: 01-Aug-1976 Age / Sex: 39 y.o., female   PCP: Provider Default, MD         Medical Service: Internal Medicine Teaching Service         Attending Physician: Dr. Aletta EdouardShilpa Bhardwaj, MD    First Contact: Fawn KirkMarisa Gray, MS4 Pager: [phone]   Second Contact: Dr. Carlynn PurlErik Hoffman  Pager: [phone]       After Hours (After 5p/  First Contact Pager: [phone]  weekends / holidays): Second Contact Pager: [phone]   Chief Complaint: Right leg swelling, redness  History of Present Illness: [name] is a 39 year old female with morbid obesity, obstructive sleep apnea, and history of recurrent cellulitis who presents today with right leg swelling, redness.  This morning, she reports waking up and going to the bathroom when she first noted the redness and swelling along the length of her right leg. She denies noticing these symptoms prior to this morning. She had some Keflex left over at home and took 2 tablets around 2:00 PM but did not feel like things were improving so she decided to come into the emergency department. She denies any purulent discharge, recent trauma to her right leg, animal bites, recent immobilization or surgery, nausea, vomiting, chest pain, shortness of breath, dysuria though does report fever, chills. She reports she has a history of cellulitis and last had in December for which she was treated with Keflex and doxycycline though this is her first episode for this year. Other than her CPAP machine at home, she does not take any medicines regularly and follows with [facility]. She lives at home with her boyfriend and pet dog though denies any recent bites and works at [employer]. She denies any recent tobacco, alcohol, illicit drug use. In the ED, she was found to have a lactate 3.9 and received 2L normal saline bolus and started on vancomycin/Zosyn.     Meds: Current  Facility-Administered Medications  Medication Dose Route Frequency Provider Last Rate Last Dose  . vancomycin (VANCOCIN) IVPB 1000 mg/200 mL premix  1,000 mg Intravenous Once Eber HongBrian Miller, MD 200 mL/hr at 03/04/15 1901 1,000 mg at 03/04/15 1901   Current Outpatient Prescriptions  Medication Sig Dispense Refill  . ibuprofen (ADVIL,MOTRIN) 800 MG tablet Take 1 tablet (800 mg total) by mouth every 8 (eight) hours as needed for pain. 21 tablet 0  . traMADol (ULTRAM) 50 MG tablet Take 1 tablet (50 mg total) by mouth every 6 (six) hours as needed for pain. 15 tablet 0    Allergies: Allergies as of 03/04/2015 - Review Complete 03/04/2015  Allergen Reaction Noted  . Bactrim [sulfamethoxazole-trimethoprim]  03/04/2015  . Sulfa antibiotics  03/04/2015   Past Medical History  Diagnosis Date  . Cellulitis ~ 2005; 03/2011; 07/2011; 07/30/12    LLE: LLE: RLE/pt; RLE "both times I have been hospitalized it's been for the right leg"  . Dysrhythmia 07/30/2012    tachycardia  . PONV (postoperative nausea and vomiting)   . On home O2 07/30/2012    "sleep w/2L @ night"  . OSA (obstructive sleep apnea)     "severe; sleep w/oxygen and BiPAP"   Past Surgical History  Procedure Laterality Date  . Hysteroscopy w/d&c  2006    for uterine polyp   No family history on file. History   Social History  . Marital Status:  Legally Separated    Spouse Name: N/A  . Number of Children: N/A  . Years of Education: N/A   Occupational History  . Not on file.   Social History Main Topics  . Smoking status: Former Smoker -- 4 years    Types: Cigarettes    Quit date: 08/15/2009  . Smokeless tobacco: Never Used     Comment: 07/30/2012 "only smoked 3-4 cigarettes on the weekends when I was smoking"  . Alcohol Use: No     Comment: 07/30/2012 "have drank; never frequent; last drink ~ 2001"  . Drug Use: No  . Sexual Activity: Yes   Other Topics Concern  . Not on file   Social History Narrative    Review of  Systems: As noted in the history of present illness  Physical Exam: Blood pressure 102/47, pulse 110, temperature 102.5 F (39.2 C), temperature source Rectal, resp. rate 26, height  (1.6 m), weight 487 lb (220.902 kg), SpO2 99 %.  General: Morbidly obese Caucasian female, no acute distress Head: PERRL, EOMI, no scleral icterus, oropharynx clear, moist mucous membranes  Cardiac: sinus tachycardia though difficult to auscultate given large body habitus Lungs: clear to auscultation bilaterally, no wheezes, rales, or rhonchi in the anterior lung fields  Abd: soft, nontender, nondistended, BS present Ext: dorsalis pedis pulses intact bilaterally      Neuro: responds to questions appropriately; moving all extremities freely  Lab results: Basic Metabolic Panel:  Recent Labs  16/10/96 1820  NA 136  K 4.6  CL 101  GLUCOSE 136*  BUN 16  CREATININE 0.70   Liver Function Tests:  Recent Labs  03/04/15 1813  AST 76*  ALT 53*  ALKPHOS 46  BILITOT 1.6*  PROT 7.6  ALBUMIN 3.8   CBC:  Recent Labs  03/04/15 1739 03/04/15 1820  WBC 9.6  --   NEUTROABS 9.0*  --   HGB 14.6 15.6*  HCT 44.1 46.0  MCV 93.0  --   PLT 189  --     Assessment & Plan by Problem:  Sepsis: Likely secondary to her cellulitis. Chest x-ray without acute infiltrate. Given the source of infection, she show signs of systemic response tachycardia, fever, increased respiratory rate. Lab work notable for increased lactate. Lack of purulence makes MRSA less likely though unsure given that she has a prior history of recurrent infections per her report and as noted in the chart. -Continue vancomycin/Zosyn with Benadryl 25 mg IV pretreatment -Continue normal saline at 150 mL/hour  -Give Zofran 4 mg IV every 8 hours as needed for nausea/vomiting -Monitor on telemetry -Give Norco 5/325 mg 1-2 tablets every 4 hours as needed for pain -Recheck CBC in [facility] in the morning -Follow blood and urine  cultures  Morbid obesity: Admission weight 477 pounds, BMI 84.7. She reports that she recently had blood work done at her PCPs office though has never been told that she has diabetes or hyperlipidemia; glucose 136 on admission. -Check A1c  Abnormal LFTs: Total bilirubin 1.6 on admission. Also notable elevations in transaminases likely in the setting of her acute illness. -Check HIV, fractionated bilirubin  Obstructive sleep apnea: Continue CPAP  #FEN:  -Diet: Heart healthy  #DVT prophylaxis: Lovenox  #CODE STATUS: FULL CODE -Defer to father Sherrin Daisy [phone] if patients lacks decision-making capacity -Confirmed with patient on admission    Dispo: Disposition is deferred at this time, awaiting improvement of current medical problems.   The patient does have a current PCP (Provider Default, MD)  and does not need an [facility] hospital follow-up appointment after discharge.  The patient does not know have transportation limitations that hinder transportation to clinic appointments.  Signed: Beather Arbour, MD 03/04/2015, 7:21 PM

## 2015-03-04 NOTE — ED Provider Notes (Signed)
CSN: [id number]     Arrival date & time 03/04/15  1723 History   First MD Initiated Contact with Patient 03/04/15 1747     Chief Complaint  Patient presents with  . Cellulitis     (Consider location/radiation/quality/duration/timing/severity/associated sxs/prior Treatment) HPI Comments: The pt is a 39 y/o morbidly obese female who presents with swelling and redness to the RLE.  This is not the first time that she has had this infection, she has had this in the past and according to the medical record she was admitted to the hospital in 2013 cellulitis of a similar location. She does have a history of what appears to be lymphedema. She denies being a diabetic, denies fevers, denies chills, has had a slightly decreased appetite today. The swelling is persistent, associated with redness and warmth, tender to the touch.  The history is provided by the patient.    Past Medical History  Diagnosis Date  . Cellulitis ~ 2005; 03/2011; 07/2011; 07/30/12    LLE: LLE: RLE/pt; RLE "both times I have been hospitalized it's been for the right leg"  . Dysrhythmia 07/30/2012    tachycardia  . PONV (postoperative nausea and vomiting)   . On home O2 07/30/2012    "sleep w/2L @ night"  . OSA (obstructive sleep apnea)     "severe; sleep w/oxygen and BiPAP"   Past Surgical History  Procedure Laterality Date  . Hysteroscopy w/d&c  2006    for uterine polyp   No family history on file. History  Substance Use Topics  . Smoking status: Former Smoker -- 4 years    Types: Cigarettes    Quit date: 08/15/2009  . Smokeless tobacco: Never Used     Comment: 07/30/2012 "only smoked 3-4 cigarettes on the weekends when I was smoking"  . Alcohol Use: No     Comment: 07/30/2012 "have drank; never frequent; last drink ~ 2001"   OB History    No data available     Review of Systems  All other systems reviewed and are negative.     Allergies  Bactrim; Sulfa antibiotics; and Vancomycin  Home Medications    Prior to Admission medications   Medication Sig Start Date End Date Taking? Authorizing Provider  ibuprofen (ADVIL,MOTRIN) 800 MG tablet Take 1 tablet (800 mg total) by mouth every 8 (eight) hours as needed for pain. 10/10/12   Adlih Moreno-Coll, MD  traMADol (ULTRAM) 50 MG tablet Take 1 tablet (50 mg total) by mouth every 6 (six) hours as needed for pain. 10/10/12   Adlih Moreno-Coll, MD   BP 119/58 mmHg  Pulse 106  Temp(Src) 98.9 F (37.2 C) (Oral)  Resp 27  Ht  (1.6 m)  Wt 480 lb (217.727 kg)  BMI 85.05 kg/m2  SpO2 96%  LMP  (LMP Unknown) Physical Exam  Constitutional: She appears well-developed and well-nourished. No distress.  HENT:  Head: Normocephalic and atraumatic.  Mouth/Throat: Oropharynx is clear and moist. No oropharyngeal exudate.  Eyes: Conjunctivae and EOM are normal. Pupils are equal, round, and reactive to light. Right eye exhibits no discharge. Left eye exhibits no discharge. No scleral icterus.  Neck: Normal range of motion. Neck supple. No JVD present. No thyromegaly present.  Cardiovascular: Regular rhythm, normal heart sounds and intact distal pulses.  Exam reveals no gallop and no friction rub.   No murmur heard. HR 110  Pulmonary/Chest: Effort normal and breath sounds normal. No respiratory distress. She has no wheezes. She has no rales.  Abdominal:  Soft. Bowel sounds are normal. She exhibits no distension and no mass. There is no tenderness.  Mobese - non tender  Musculoskeletal: Normal range of motion. She exhibits edema and tenderness.  Lymphadenopathy:    She has no cervical adenopathy.  Neurological: She is alert. Coordination normal.  Skin: Skin is warm and dry. No rash noted. There is erythema.  Psychiatric: She has a normal mood and affect. Her behavior is normal.  Nursing note and vitals reviewed.   ED Course  Procedures (including critical care time) Labs Review Labs Reviewed  CBC WITH DIFFERENTIAL/PLATELET - Abnormal; Notable for  the following:    Neutrophils Relative % 94 (*)    Neutro Abs 9.0 (*)    Lymphocytes Relative 3 (*)    Lymphs Abs 0.3 (*)    All other components within normal limits  COMPREHENSIVE METABOLIC PANEL - Abnormal; Notable for the following:    Sodium 134 (*)    Glucose, Bld 119 (*)    AST 76 (*)    ALT 53 (*)    Total Bilirubin 1.6 (*)    All other components within normal limits  LACTIC ACID, PLASMA - Abnormal; Notable for the following:    Lactic Acid, Venous 2.4 (*)    All other components within normal limits  COMPREHENSIVE METABOLIC PANEL - Abnormal; Notable for the following:    Potassium 2.9 (*)    Glucose, Bld 135 (*)    Calcium 8.3 (*)    Albumin 2.9 (*)    Alkaline Phosphatase 38 (*)    All other components within normal limits  CBC - Abnormal; Notable for the following:    WBC 13.8 (*)    All other components within normal limits  CBC - Abnormal; Notable for the following:    WBC 14.8 (*)    All other components within normal limits  BASIC METABOLIC PANEL - Abnormal; Notable for the following:    Glucose, Bld 133 (*)    Calcium 8.1 (*)    All other components within normal limits  GLUCOSE, CAPILLARY - Abnormal; Notable for the following:    Glucose-Capillary 115 (*)    All other components within normal limits  I-STAT CHEM 8, ED - Abnormal; Notable for the following:    Glucose, Bld 136 (*)    Calcium, Ion 1.02 (*)    Hemoglobin 15.6 (*)    All other components within normal limits  I-STAT CG4 LACTIC ACID, ED - Abnormal; Notable for the following:    Lactic Acid, Venous 3.91 (*)    All other components within normal limits  MRSA PCR SCREENING  CULTURE, BLOOD (ROUTINE X 2)  CULTURE, BLOOD (ROUTINE X 2)  LACTIC ACID, PLASMA  HIV ANTIBODY (ROUTINE TESTING)  HEMOGLOBIN A1C    Imaging Review Dg Chest Port 1 View  03/04/2015   CLINICAL DATA:  Cellulitis.  EXAM: PORTABLE CHEST - 1 VIEW  COMPARISON:  None.  FINDINGS: The heart size and mediastinal contours are  within normal limits. Both lungs are clear. The visualized skeletal structures are unremarkable.  IMPRESSION: Normal exam.   Electronically Signed   By: Francene BoyersJames  Maxwell M.D.   On: 03/04/2015 19:45      MDM   Final diagnoses:  Sepsis, due to unspecified organism  Cellulitis of right lower extremity    Bedside ultrasound shows cobblestoning, no signs of discrete abscess collection, there is no areas of fluctuance, the erythema is circumferential from the knee to the ankle, it also extends proximally to  the right medial proximal 5. She will need admission to the hospital for IV antibiotics, she does not have hypotension fever and does not appear ill but she does have a significant source of cellulitis. Vancomycin ordered, will consult with hospitalist.  Vital signs reveal that the patient is febrile over 102, persistent with tachycardic, no vomiting, no leukocytosis, source of cellulitis, this meet surge criteria with a source, the patient is septic, lactic acid is 3.9, fluid resuscitation and broad-spectrum metabolic ordered.  D/w CC they recommend 4 L of fluids to start  CRITICAL CARE Performed by: Vida Roller Total critical care time: 35 Critical care time was exclusive of separately billable procedures and treating other patients. Critical care was necessary to treat or prevent imminent or life-threatening deterioration. Critical care was time spent personally by me on the following activities: development of treatment plan with patient and/or surrogate as well as nursing, discussions with consultants, evaluation of patient's response to treatment, examination of patient, obtaining history from patient or surrogate, ordering and performing treatments and interventions, ordering and review of laboratory studies, ordering and review of radiographic studies, pulse oximetry and re-evaluation of patient's condition.   Meds given in ED:  Medications  sodium chloride 0.9 % injection 3 mL (3  mLs Intravenous Given 03/04/15 2203)  HYDROcodone-acetaminophen (NORCO/VICODIN) 5-325 MG per tablet 1-2 tablet (2 tablets Oral Given 03/05/15 0436)  ondansetron (ZOFRAN) tablet 4 mg (not administered)    Or  ondansetron (ZOFRAN) injection 4 mg (not administered)  piperacillin-tazobactam (ZOSYN) IVPB 3.375 g (0 g Intravenous Duplicate 03/05/15 0436)  diphenhydrAMINE (BENADRYL) injection 25 mg (25 mg Intravenous Given 03/05/15 0055)  vancomycin (VANCOCIN) 1,500 mg in sodium chloride 0.9 % 500 mL IVPB (1,500 mg Intravenous Given 03/05/15 0055)  0.9 %  sodium chloride infusion (not administered)  potassium chloride SA (K-DUR,KLOR-CON) CR tablet 40 mEq (not administered)  enoxaparin (LOVENOX) injection 100 mg (not administered)  sodium chloride 0.9 % bolus 1,000 mL (0 mLs Intravenous Stopped 03/04/15 2112)  sodium chloride 0.9 % bolus 1,000 mL (1,000 mLs Intravenous Transfusing/Transfer 03/04/15 2113)  acetaminophen (TYLENOL) tablet 1,000 mg (1,000 mg Oral Given 03/04/15 1835)  diphenhydrAMINE (BENADRYL) injection 25 mg (25 mg Intravenous Given 03/04/15 1939)  piperacillin-tazobactam (ZOSYN) IVPB 3.375 g (0 g Intravenous Stopped 03/04/15 2033)      Eber Hong, MD 03/05/15 [phone]

## 2015-03-04 NOTE — Progress Notes (Addendum)
ANTIBIOTIC CONSULT NOTE - INITIAL  Pharmacy Consult for Vancomycin  Indication: cellulitis  Allergies  Allergen Reactions  . Bactrim [Sulfamethoxazole-Trimethoprim]   . Sulfa Antibiotics   . Vancomycin Hives    Patient Measurements: Height: 5\' 3"  (160 cm) Weight: (!) 477 lb (216.366 kg) IBW/kg (Calculated) : 52.4 Adjusted Body Weight:  120 kg  Vital Signs: Temp: 102.5 F (39.2 C) (03/20 1834) Temp Source: Rectal (03/20 1834) BP: 100/49 mmHg (03/20 2141) Pulse Rate: 106 (03/20 2141) Intake/Output from previous day:   Intake/Output from this shift: Total I/O In: 3 [I.V.:3] Out: -   Labs:  Recent Labs  03/04/15 1739 03/04/15 1813 03/04/15 1820  WBC 9.6  --   --   HGB 14.6  --  15.6*  PLT 189  --   --   CREATININE  --  0.80 0.70   Estimated Creatinine Clearance: 181.1 mL/min (by C-G formula based on Cr of 0.7). No results for input(s): VANCOTROUGH, VANCOPEAK, VANCORANDOM, GENTTROUGH, GENTPEAK, GENTRANDOM, TOBRATROUGH, TOBRAPEAK, TOBRARND, AMIKACINPEAK, AMIKACINTROU, AMIKACIN in the last 72 hours.   Microbiology: No results found for this or any previous visit (from the past 720 hour(s)).  Medical History: Past Medical History  Diagnosis Date  . Cellulitis ~ 2005; 03/2011; 07/2011; 07/30/12    LLE: LLE: RLE/pt; RLE "both times I have been hospitalized it's been for the right leg"  . Dysrhythmia 07/30/2012    tachycardia  . PONV (postoperative nausea and vomiting)   . On home O2 07/30/2012    "sleep w/2L @ night"  . OSA (obstructive sleep apnea)     "severe; sleep w/oxygen and BiPAP"    Medications:  Prescriptions prior to admission  Medication Sig Dispense Refill Last Dose  . ibuprofen (ADVIL,MOTRIN) 800 MG tablet Take 1 tablet (800 mg total) by mouth every 8 (eight) hours as needed for pain. 21 tablet 0   . traMADol (ULTRAM) 50 MG tablet Take 1 tablet (50 mg total) by mouth every 6 (six) hours as needed for pain. 15 tablet 0    Assessment: 39 yo female  with RLE swelling/cellulitis for empiric antibiotics.  Discussed with Dr. Sherrine MaplesGlenn.  Pt experienced some localized hives after receiving vancomycin in ED earlier today.  Will rechallenge and try to manage symptoms by pretreating with Benadryl 25 mg IV and reducing the infusion rate.  Goal of Therapy:  Vancomycin trough level 10-15 mcg/ml  Plan:  Vancomycin 1500 mg IV q8h  Eddie Candlebbott, Gregory Vernon 03/04/2015,11:02 PM   Increase Lovenox to 100 mg sq Q 24 hour for DVT prophlyaxis  Thank you. Okey RegalLisa Allesandra Huebsch, PharmD 03/05/15

## 2015-03-04 NOTE — Progress Notes (Signed)
Patient has her home CPAP that I placed on her for the night.  Patient is tolerating well at this time.

## 2015-03-05 DIAGNOSIS — B9689 Other specified bacterial agents as the cause of diseases classified elsewhere: Secondary | ICD-10-CM

## 2015-03-05 LAB — HIV ANTIBODY (ROUTINE TESTING W REFLEX): HIV SCREEN 4TH GENERATION: NONREACTIVE

## 2015-03-05 LAB — BASIC METABOLIC PANEL
ANION GAP: 8 (ref 5–15)
BUN: 11 mg/dL (ref 6–23)
CO2: 23 mmol/L (ref 19–32)
Calcium: 8.1 mg/dL — ABNORMAL LOW (ref 8.4–10.5)
Chloride: 104 mmol/L (ref 96–112)
Creatinine, Ser: 0.63 mg/dL (ref 0.50–1.10)
GFR calc Af Amer: >90 mL/min (ref >90)
GLUCOSE: 133 mg/dL — AB (ref 70–99)
POTASSIUM: 3.5 mmol/L (ref 3.5–5.1)
SODIUM: 135 mmol/L (ref 135–145)

## 2015-03-05 LAB — CBC
HCT: 37.8 % (ref 36.0–46.0)
HEMATOCRIT: 38.8 % (ref 36.0–46.0)
HEMOGLOBIN: 12.4 g/dL (ref 12.0–15.0)
Hemoglobin: 12.8 g/dL (ref 12.0–15.0)
MCH: 30.2 pg (ref 26.0–34.0)
MCH: 30.4 pg (ref 26.0–34.0)
MCHC: 32.8 g/dL (ref 30.0–36.0)
MCHC: 33 g/dL (ref 30.0–36.0)
MCV: 92.2 fL (ref 78.0–100.0)
MCV: 92.2 fL (ref 78.0–100.0)
Platelets: 171 10*3/uL (ref 150–400)
Platelets: 182 10*3/uL (ref 150–400)
RBC: 4.1 MIL/uL (ref 3.87–5.11)
RBC: 4.21 MIL/uL (ref 3.87–5.11)
RDW: 14.2 % (ref 11.5–15.5)
RDW: 14.4 % (ref 11.5–15.5)
WBC: 14.8 10*3/uL — AB (ref 4.0–10.5)
WBC: 13.8 10*3/uL — AB (ref 4.0–10.5)

## 2015-03-05 LAB — COMPREHENSIVE METABOLIC PANEL
ALT: 20 U/L (ref 0–35)
ANION GAP: 5 (ref 5–15)
AST: 23 U/L (ref 0–37)
Albumin: 2.9 g/dL — ABNORMAL LOW (ref 3.5–5.2)
Alkaline Phosphatase: 38 U/L — ABNORMAL LOW (ref 39–117)
BUN: 9 mg/dL (ref 6–23)
CO2: 27 mmol/L (ref 19–32)
Calcium: 8.3 mg/dL — ABNORMAL LOW (ref 8.4–10.5)
Chloride: 103 mmol/L (ref 96–112)
Creatinine, Ser: 0.66 mg/dL (ref 0.50–1.10)
GFR calc Af Amer: >90 mL/min (ref >90)
Glucose, Bld: 135 mg/dL — ABNORMAL HIGH (ref 70–99)
POTASSIUM: 2.9 mmol/L — AB (ref 3.5–5.1)
Sodium: 135 mmol/L (ref 135–145)
Total Bilirubin: 1.2 mg/dL (ref 0.3–1.2)
Total Protein: 6.3 g/dL (ref 6.0–8.3)

## 2015-03-05 LAB — GLUCOSE, CAPILLARY
GLUCOSE-CAPILLARY: 91 mg/dL (ref 70–99)
Glucose-Capillary: 115 mg/dL — ABNORMAL HIGH (ref 70–99)
Glucose-Capillary: 124 mg/dL — ABNORMAL HIGH (ref 70–99)

## 2015-03-05 LAB — LACTIC ACID, PLASMA: LACTIC ACID, VENOUS: 1.9 mmol/L (ref 0.5–2.0)

## 2015-03-05 LAB — MRSA PCR SCREENING: MRSA by PCR: NEGATIVE

## 2015-03-05 MED ORDER — SODIUM CHLORIDE 0.9 % IV SOLN
INTRAVENOUS | Status: AC
  Administered 2015-03-05: 09:00:00 via INTRAVENOUS

## 2015-03-05 MED ORDER — POTASSIUM CHLORIDE CRYS ER 20 MEQ PO TBCR
40.0000 meq | EXTENDED_RELEASE_TABLET | Freq: Once | ORAL | Status: AC
  Administered 2015-03-05: 40 meq via ORAL
  Filled 2015-03-05: qty 2

## 2015-03-05 MED ORDER — ENOXAPARIN SODIUM 100 MG/ML ~~LOC~~ SOLN
100.0000 mg | SUBCUTANEOUS | Status: DC
  Administered 2015-03-05 – 2015-03-06 (×2): 100 mg via SUBCUTANEOUS
  Filled 2015-03-05 (×3): qty 1

## 2015-03-05 NOTE — Discharge Summary (Signed)
Name: Amber Kline MRN: [id number] DOB: [birth year] 39 y.o. PCP: Macy MisKim K Briscoe, MD  Date of Admission: 03/04/2015  5:47 PM Date of Discharge: 03/07/2015 Attending Physician: Aletta EdouardShilpa Bhardwaj, MD  Discharge Diagnosis: Principal Problem:   Sepsis Active Problems:   Obstructive sleep apnea   Hypokalemia   Hyperglycemia   Morbid obesity   Cellulitis of right lower extremity  Discharge Medications:   Medication List    TAKE these medications        cephALEXin 500 MG capsule  Commonly known as:  KEFLEX  Take 1 capsule (500 mg total) by mouth every 6 (six) hours.     doxycycline 100 MG tablet  Commonly known as:  VIBRA-TABS  Take 1 tablet (100 mg total) by mouth 2 (two) times daily.     ibuprofen 800 MG tablet  Commonly known as:  ADVIL,MOTRIN  Take 1 tablet (800 mg total) by mouth every 8 (eight) hours as needed for pain.     traMADol 50 MG tablet  Commonly known as:  ULTRAM  Take 1 tablet (50 mg total) by mouth every 6 (six) hours as needed for pain.        Disposition and follow-up:   Ms.Amber Kline [facility] in Stable [facility].  At the hospital follow up visit please address:  1.  Sepsis 2/2 RLE Cellulitis: Please monitor for complete resolution of symptoms. She was discharged with a 10 additional day course of Keflex and Doxycycline.  2.  Labs / imaging needed at time of follow-up: Noen  3.  Pending labs/ test needing follow-up: None  Follow-up Appointments:   Discharge Instructions: Discharge Instructions    Call MD for:  temperature >100.4    Complete by:  As directed      Diet - low sodium heart healthy    Complete by:  As directed      Discharge instructions    Complete by:  As directed   I want you to take both Keflex and Doxycycline for 10 additional days. Please follow up with your PCP early next week.     Increase activity slowly    Complete by:  As directed            Consultations:      Procedures Performed:  Dg Chest Port 1 View  03/04/2015   CLINICAL DATA:  Cellulitis.  EXAM: PORTABLE CHEST - 1 VIEW  COMPARISON:  None.  FINDINGS: The heart size and mediastinal contours are within normal limits. Both lungs are clear. The visualized skeletal structures are unremarkable.  IMPRESSION: Normal exam.   Electronically Signed   By: Francene BoyersJames  Maxwell M.D.   On: 03/04/2015 19:45    2D Echo: Not performed    Cardiac Cath: Not performed   Admission HPI: Ms. Amber LiaOrozco-Martinez is a [age] year old female with morbid obesity, obstructive sleep apnea, and history of recurrent cellulitis who presents today with right leg swelling, redness.  This morning, she reports waking up and going to the bathroom when she first noted the redness and swelling along the length of her right leg. She denies noticing these symptoms prior to this morning. She had some Keflex left over at home and took 2 tablets around 2:00 PM but did not feel like things were improving so she decided to come into the emergency department. She denies any purulent discharge, recent trauma to her right leg, animal bites, recent immobilization or surgery, nausea, vomiting, chest pain, shortness of breath, dysuria though does  report fever, chills. She reports she has a history of cellulitis and last had in December for which she was treated with Keflex and doxycycline though this is her first episode for this year. Other than her CPAP machine at home, she does not take any medicines regularly and follows with [facility]. She lives at home with her boyfriend and pet dog though denies any recent bites and works at [employer] firm. She denies any recent tobacco, alcohol, illicit drug use. In the ED, she was found to have a lactate 3.9 and received 2L normal saline bolus and started on vancomycin/Zosyn.   Hospital Course by problem list: Principal Problem:   Sepsis Active Problems:   Obstructive sleep apnea   Hypokalemia    Hyperglycemia   Morbid obesity   Cellulitis of right lower extremity   Sepsis 2/2 RLE cellulitis: On admission, she was tachycardic with hypotension, fever, tachypnea and elevated lactic acid at 3.91. She was started on vancomycin and zosyn for 48 hours until significant improvement was seen. Zosyn was discontinued on HOD3 and vancomycin was continued for one more day. She was then transitioned for oral antibiotics and discharged on HOD 4. The patient was pretreated with Benadryl 25 mg IV prior to vancomycin due to localized reaction that occurred during admission. She was continued on IV fluids until her pressures had stabilized in the normal range. She was treated with Norco 5/325 mg 1-2 tablets every 4 hours as needed for pain. She was monitored on telemetry with no abnormal rhythms. At discharge, her blood and urine cultures were negative, leukocytosis was trending down and she was afebrile for approx 48 hours. She will be discharged with doxycycline and keflex for antibiotic coverage of MRSA and streptococcus species.  Hypokalemia: Patient had intermittent hypokalemia that was repleted with potassium citrate during hospitalization.   Morbid obesity: Admission weight 477 pounds, BMI 84.7. She reports that she recently had blood work done at her PCPs office though has never been told that she has diabetes or hyperlipidemia; glucose 136 on admission.  An A1c was checked and returned at 5.5%.  Abnormal LFTs: Total bilirubin 1.6 on admission. Also notable elevations in transaminases likely in the setting of her acute illness (possible mild hypoperfusion on admission). Resolved with fluids. HIV was negative.   Obstructive sleep apnea: Continue CPAP  Discharge Vitals:   BP 128/71 mmHg  Pulse 102  Temp(Src) 98.4 F (36.9 C) (Oral)  Resp 20  Ht  (1.6 m)  Wt 491 lb (222.716 kg)  BMI 87.00 kg/m2  SpO2 100%  LMP  (LMP Unknown)  Discharge Labs:  Results for orders placed or performed during  the hospital encounter of 03/04/15 (from the past 24 hour(s))  CBC Once     Status: Abnormal   Collection Time: 03/07/15  6:15 AM  Result Value Ref Range   WBC 8.2 4.0 - 10.5 K/uL   RBC 3.63 (L) 3.87 - 5.11 MIL/uL   Hemoglobin 10.8 (L) 12.0 - 15.0 g/dL   HCT 37.1 (L) 69.6 - 78.9 %   MCV 92.6 78.0 - 100.0 fL   MCH 29.8 26.0 - 34.0 pg   MCHC 32.1 30.0 - 36.0 g/dL   RDW 38.1 01.7 - 51.0 %   Platelets 161 150 - 400 K/uL  Basic metabolic panel Once     Status: Abnormal   Collection Time: 03/07/15  6:15 AM  Result Value Ref Range   Sodium 136 135 - 145 mmol/L   Potassium 3.4 (  L) 3.5 - 5.1 mmol/L   Chloride 106 96 - 112 mmol/L   CO2 26 19 - 32 mmol/L   Glucose, Bld 107 (H) 70 - 99 mg/dL   BUN <5 (L) 6 - 23 mg/dL   Creatinine, Ser 1.61 0.50 - 1.10 mg/dL   Calcium 7.8 (L) 8.4 - 10.5 mg/dL   GFR calc non Af Amer >90 >90 mL/min   GFR calc Af Amer >90 >90 mL/min   Anion gap 4 (L) 5 - 15    Signed: Gust Rung, DO 03/07/2015, 10:40 AM    Services Ordered on Discharge: None  Equipment Ordered on Discharge: None

## 2015-03-05 NOTE — Progress Notes (Signed)
Amber Kline [id number]  Transfer Data: 03/05/2015 6:18 PM  Attending Provider: Aletta EdouardShilpa Bhardwaj, MD  WUJ:[id number]:Default, Provider, MD  Code Status: Full  Amber Kline is a 39 y.o. female patient transferred from 2c  -No acute distress noted.  -No complaints of shortness of breath.  -No complaints of chest pain.  Cardiac Monitoring:  Box # 3 in place.   Blood pressure 111/56, pulse 115, temperature 99.9 F (37.7 C), temperature source Oral, resp. rate 22, height 5\' 3"  (1.6 m), weight 217.727 kg (480 lb), SpO2 91 %.  ?  IV Fluids: IV in place, occlusive dsg intact without redness, IV cath hand right, condition patent and no redness  normal saline.  Allergies: Bactrim; Sulfa antibiotics; and Vancomycin  Past Medical History:  has a past medical history of Cellulitis (~ 2005; 03/2011; 07/2011; 07/30/12); Dysrhythmia (07/30/2012); PONV (postoperative nausea and vomiting); On home O2 (07/30/2012); and OSA (obstructive sleep apnea).  Past Surgical History:  has past surgical history that includes Hysteroscopy w/D&C (2006).  Social History:  reports that she quit smoking about 5 years ago. Her smoking use included Cigarettes. She quit after 4 years of use. She has never used smokeless tobacco. She reports that she does not drink alcohol or use illicit drugs.    Patient/Family orientated to room. Information packet given to patient/family. Admission inpatient armband information verified with patient/family to include name and date of birth and placed on patient arm. Side rails up x 2, fall assessment and education completed with patient/family. Patient/family able to verbalize understanding of risk associated with falls and verbalized understanding to call for assistance before getting out of bed. Call light within reach. Patient/family able to voice and demonstrate understanding of unit orientation instructions.  Will continue to evaluate and treat per MD orders.

## 2015-03-05 NOTE — Progress Notes (Signed)
Pt states she does not require any assistance with home CPAP machine or mask application. RT added sterile water to CPAP for humidification per pt request. RT will continue to monitor as needed.

## 2015-03-05 NOTE — Progress Notes (Signed)
CRITICAL VALUE ALERT  Critical value received: Potassium 2.9  Date of notification:  03/05/2015  Time of notification:  0100   Critical value read back: Yes  Nurse who received alert:  Julious OkaKarimah Abdussalaam RN   MD notified (1st page):  0100   Time of first page:   MD notified (2nd page):  Time of second page:  Responding MD:  Dr. Allena KatzPatel  Time MD responded:  [phone]

## 2015-03-05 NOTE — Progress Notes (Signed)
Utilization Review Completed.  

## 2015-03-05 NOTE — Progress Notes (Signed)
Amber Kline:[id number] DOB: 1976/02/12 DOA: 03/04/2015 PCP: Default, Provider, MD Assessment/ Plan:   39 year old woman with past medical history of morbid obesity, obstructive sleep apnea and recurrent cellulitis was admitted with cellulitis of the right lower extremity with accompanying sepsis.  Sepsis due to right lower extremity cellulitis: BP has improved with volume resuscitation. She remains tachycardic. Lactic acidosis has cleared. MAXIMUM TEMPERATURE 100.9 yesterday at 4 PM. Remains with elevated WBC count of 14. Plan -Continue with Vancomycin and Zosyn -Continue with Vicodin for pain -We'll keep her in the stepdown unit due to continuing tachycardia and transfer tomorrow is she is better. -Continue with IV fluids at 125 cc/h and reevaluate tomorrow -There appears to be no focal areas of fluctuation or pus accumulation. Imaging may not be necessary.  Hyperglycemia: CBG above 130. Patient is not known diabetic. Will check A1c, given her risk factors including morbid obesity  Morbidly obese: BMI of 84. Almost bedbound. She does have history of OSA. Going forward, she will need to reduce weight. Continue with CPAP as needed.   F/E/N: HH diet. Monitor and replace electrolytes as needed. Will give oral potassium and check BMP tomorrow.   VTE Ppx: Lovenox  CODE STATUS: Full code  Family Communication: discussed with the patient about plan of care and all questions have been answered.  Disposition: She will remain in the SDU for now as her sepsis continues to improve. Will continue with abx. She will remain admitted for the next 1-2 days.    The patient does have current PCP (Default, Provider, MD), therefore will be require OPC follow-up after discharge.   The patient does not have transportation limitations that hinder transportation to clinic appointments.  .Services Needed at time of discharge: Y = Yes, Blank = No PT:   OT:   RN:   Equipment:   Other:     Length of Stay: 1 days   Subjective/Interval Events:    Subjective:  Still complains of pain in the right lower extremity. Still tachycardic. No fevers overnight.  Interval Events: No overnight events.    Objective:     Last BM Date: 03/03/15   Weights: 24-hour Weight change:   Filed Weights   03/04/15 1729 03/04/15 2126 03/05/15 0448  Weight: 487 lb (220.902 kg) 477 lb (216.366 kg) 480 lb (217.727 kg)     Intake/Output:   Intake/Output Summary (Last 24 hours) at 03/05/15 1101 Last data filed at 03/05/15 0400  Gross per 24 hour  Intake    693 ml  Output    300 ml  Net    393 ml       Physical Exam: Vital Signs:   Temp:  [97.8 F (36.6 C)-102.5 F (39.2 C)] 98.9 F (37.2 C) (03/21 0800) Pulse Rate:  [91-138] 106 (03/21 0800) Resp:  [20-28] 27 (03/21 0800) BP: (92-124)/(31-100) 119/58 mmHg (03/21 0800) SpO2:  [92 %-100 %] 96 % (03/21 0800) Weight:  [477 lb (216.366 kg)-487 lb (220.902 kg)] 480 lb (217.727 kg) (03/21 0448) General: morbidly obese. Vital signs reviewed. No distress.  Lungs: Clear to auscultation bilaterally  Heart: RRR; no extra sounds or murmurs  Abdomen: Bowel sounds present, soft, nontender; no hepatosplenomegaly  Extremities: Right lower extremity without extension of her area of marked erythema. No focal areas suspicious for pus accumulation. No open wounds.  Neurologic: Alert and oriented x3. Moves all extremities  Labs: Basic Metabolic Panel:  Recent Labs Lab 03/04/15 1813 03/04/15 1820 03/05/15 0016 03/05/15 0220  NA 134* 136  135 135  K 5.1 4.6 2.9* 3.5  CL 100 101 103 104  CO2 21  --  27 23  GLUCOSE 119* 136* 135* 133*  BUN 11 16 9 11   CREATININE 0.80 0.70 0.66 0.63  CALCIUM 8.9  --  8.3* 8.1*    Liver Function Tests:  Recent Labs Lab 03/04/15 1813 03/05/15 0016  AST 76* 23  ALT 53* 20  ALKPHOS 46 38*  BILITOT 1.6* 1.2  PROT 7.6 6.3  ALBUMIN 3.8 2.9*     CBC:  Recent Labs Lab 03/04/15 1739  03/04/15 1820 03/05/15 0016 03/05/15 0220  WBC 9.6  --  13.8* 14.8*  NEUTROABS 9.0*  --   --   --   HGB 14.6 15.6* 12.8 12.4  HCT 44.1 46.0 38.8 37.8  MCV 93.0  --  92.2 92.2  PLT 189  --  171 182    Cardiac Enzymes: No results for input(s): CKTOTAL, CKMB, CKMBINDEX, TROPONINI in the last 168 hours.   CBG:  Recent Labs Lab 03/05/15 0916  GLUCAP 115*    Coagulation Studies: No results for input(s): LABPROT, INR in the last 72 hours.  Microbiology: Results for orders placed or performed during the hospital encounter of 03/04/15  MRSA PCR Screening     Status: None   Collection Time: 03/04/15  9:58 PM  Result Value Ref Range Status   MRSA by PCR NEGATIVE NEGATIVE Final    Comment:        The GeneXpert MRSA Assay (FDA approved for NASAL specimens only), is one component of a comprehensive MRSA colonization surveillance program. It is not intended to diagnose MRSA infection nor to guide or monitor treatment for MRSA infections.      Imaging: Dg Chest Port 1 View  03/04/2015   CLINICAL DATA:  Cellulitis.  EXAM: PORTABLE CHEST - 1 VIEW  COMPARISON:  None.  FINDINGS: The heart size and mediastinal contours are within normal limits. Both lungs are clear. The visualized skeletal structures are unremarkable.  IMPRESSION: Normal exam.   Electronically Signed   By: Francene BoyersJames  Maxwell M.D.   On: 03/04/2015 19:45      Medications:    Infusions: . sodium chloride       Scheduled Medications: . diphenhydrAMINE  25 mg Intravenous Q8H  . enoxaparin (LOVENOX) injection  100 mg Subcutaneous Q24H  . piperacillin-tazobactam (ZOSYN)  IV  3.375 g Intravenous Q8H  . sodium chloride  3 mL Intravenous Q12H  . vancomycin  1,500 mg Intravenous Q8H     PRN Medications: HYDROcodone-acetaminophen, ondansetron **OR** ondansetron (ZOFRAN) IV  Principal Problem:   Sepsis Active Problems:   Obstructive sleep apnea   Hyperglycemia   Morbid obesity   Cellulitis of right lower  extremity   Signed by:  Dow Adolphichard Bevely Hackbart, MD PGY-3, Internal Medicine  Pager [phone]  03/05/2015, 11:01 AM

## 2015-03-05 NOTE — Progress Notes (Signed)
Subjective: Amber Kline. Pt is improving. Pain well controlled with oral medication. No nausea/vomiting.   Objective: Vital signs in last 24 hours: Filed Vitals:   03/05/15 0300 03/05/15 0400 03/05/15 0448 03/05/15 0800  BP: 108/57 117/63  119/58  Pulse: 91 92  106  Temp:  100.9 F (38.3 C)  98.9 F (37.2 C)  TempSrc:  Rectal  Oral  Resp: 25 22  27   Height:      Weight:   217.727 kg (480 lb)   SpO2: 94% 96%  96%   Weight change:   Intake/Output Summary (Last 24 hours) at 03/05/15 0937 Last data filed at 03/05/15 0400  Gross per 24 hour  Intake    693 ml  Output    300 ml  Net    393 ml   BP 119/58 mmHg  Pulse 106  Temp(Src) 98.9 F (37.2 C) (Oral)  Resp 27  Ht 5\' 3"  (1.6 m)  Wt 217.727 kg (480 lb)  BMI 85.05 kg/m2  SpO2 96%  LMP  (LMP Unknown) General appearance: alert, cooperative, no distress and morbidly obese Lungs: clear to ausculation of anterior lung fields, unable to sit patient up in bed to listen to posterior lung fields Abdomen: morbidly obese abdomen, NABS Extremities: bilateral lower extremities with significant lymphedema and adipose tissue, multiple 0.5-2 cm non-inflammed nodules over legs bilaterally, RLE with erythema to inner ankle to mid-inner thigh marked with pen, erythema has not spread past pen line, warm to the touch, firm, no areas of fluctuance noted on exam, DP pulse present bilaterally   Lab Results: Basic Metabolic Panel:  Recent Labs  47/82/9503/21/16 0016 03/05/15 0220  NA 135 135  K 2.9* 3.5  CL 103 104  CO2 27 23  GLUCOSE 135* 133*  BUN 9 11  CREATININE 0.66 0.63  CALCIUM 8.3* 8.1*   Liver Function Tests:  Recent Labs  03/04/15 1813 03/05/15 0016  AST 76* 23  ALT 53* 20  ALKPHOS 46 38*  BILITOT 1.6* 1.2  PROT 7.6 6.3  ALBUMIN 3.8 2.9*   No results for input(s): LIPASE, AMYLASE in the last 72 hours. No results for input(s): AMMONIA in the last 72 hours. CBC:  Recent Labs  03/04/15 1739  03/05/15 0016 03/05/15 0220    WBC 9.6  --  13.8* 14.8*  NEUTROABS 9.0*  --   --   --   HGB 14.6  < > 12.8 12.4  HCT 44.1  < > 38.8 37.8  MCV 93.0  --  92.2 92.2  PLT 189  --  171 182  < > = values in this interval not displayed. CBG:  Recent Labs  03/05/15 0916  GLUCAP 115*    Micro Results: Recent Results (from the past 240 hour(s))  MRSA PCR Screening     Status: None   Collection Time: 03/04/15  9:58 PM  Result Value Ref Range Status   MRSA by PCR NEGATIVE NEGATIVE Final    Comment:        The GeneXpert MRSA Assay (FDA approved for NASAL specimens only), is one component of a comprehensive MRSA colonization surveillance program. It is not intended to diagnose MRSA infection nor to guide or monitor treatment for MRSA infections.    Studies/Results: Dg Chest Port 1 View  03/04/2015   CLINICAL DATA:  Cellulitis.  EXAM: PORTABLE CHEST - 1 VIEW  COMPARISON:  None.  FINDINGS: The heart size and mediastinal contours are within normal limits. Both lungs are clear. The visualized skeletal structures  are unremarkable.  IMPRESSION: Normal exam.   Electronically Signed   By: Francene Boyers M.D.   On: 03/04/2015 19:45   Medications: I have reviewed the patient's current medications. Scheduled Meds: . diphenhydrAMINE  25 mg Intravenous Q8H  . enoxaparin (LOVENOX) injection  40 mg Subcutaneous Q24H  . piperacillin-tazobactam (ZOSYN)  IV  3.375 g Intravenous Q8H  . potassium chloride  40 mEq Oral Once  . sodium chloride  3 mL Intravenous Q12H  . vancomycin  1,500 mg Intravenous Q8H   Continuous Infusions: . sodium chloride     PRN Meds:.HYDROcodone-acetaminophen, ondansetron **OR** ondansetron (ZOFRAN) IV Assessment/Plan: Principal Problem:   Sepsis Active Problems:   Obstructive sleep apnea   Hyperglycemia   Morbid obesity   Cellulitis of right lower extremity  Sepsis 2/2 RLE cellulitis: Chest x-ray without acute infiltrate. On admission, she was tachycardic with hypotension, fever, and tachypnea  and lactic acidosis. Pt is improving but remains tachycardic. Lactic acid has returned to normal level.  -Continue vancomycin/Zosyn with Benadryl 25 mg IV pretreatment -Continue NS at 150 mL/hour as patient is persistently tachycardic -Give Zofran 4 mg IV every 8 hours as needed for nausea/vomiting -Monitor on telemetry -Give Norco 5/325 mg 1-2 tablets every 4 hours as needed for pain -Recheck CBC and CMET in the morning -Follow blood and urine cultures  Morbid obesity: Admission weight 477 pounds, BMI 84.7. She reports that she recently had blood work done at her PCPs office though has never been told that she has diabetes or hyperlipidemia; glucose 136 on admission. -Check A1c  Abnormal LFTs: Total bilirubin 1.6 on admission. Also notable elevations in transaminases likely in the setting of her acute illness. Resolved with fluids. -Check HIV, fractionated bilirubin - recheck CMET in AM   Obstructive sleep apnea: Continue CPAP  #FEN:  -Diet: Heart healthy - Replete potassium 40 meq once, will recheck CMET in AM  #DVT prophylaxis: Lovenox   #CODE STATUS: FULL CODE -Defer to father Sherrin Daisy [phone] if patients lacks decision-making capacity -Confirmed with patient on admission  Dispo: Disposition is deferred at this time, awaiting improvement of current medical problems.   The patient does have a current PCP (Provider Default, MD) and does not need an [facility] hospital follow-up appointment after discharge.  The patient does not know have transportation limitations that hinder transportation to clinic appointments.  This is a Psychologist, occupational Note.  The care of the patient was discussed with Dr. Zada Girt and the assessment and plan formulated with their assistance.  Please see their attached note for official documentation of the daily encounter.   LOS: 1 day   Fawn Kirk, Med Student 03/05/2015, 9:37 AM

## 2015-03-05 NOTE — Progress Notes (Signed)
Transferred to 5west rm 1

## 2015-03-06 DIAGNOSIS — R739 Hyperglycemia, unspecified: Secondary | ICD-10-CM

## 2015-03-06 DIAGNOSIS — L03115 Cellulitis of right lower limb: Secondary | ICD-10-CM

## 2015-03-06 LAB — COMPREHENSIVE METABOLIC PANEL
ALBUMIN: 2.5 g/dL — AB (ref 3.5–5.2)
ALK PHOS: 47 U/L (ref 39–117)
ALT: 25 U/L (ref 0–35)
AST: 22 U/L (ref 0–37)
Anion gap: 8 (ref 5–15)
BILIRUBIN TOTAL: 1.2 mg/dL (ref 0.3–1.2)
BUN: <5 mg/dL — ABNORMAL LOW (ref 6–23)
CO2: 24 mmol/L (ref 19–32)
Calcium: 7.9 mg/dL — ABNORMAL LOW (ref 8.4–10.5)
Chloride: 103 mmol/L (ref 96–112)
Creatinine, Ser: 0.66 mg/dL (ref 0.50–1.10)
GFR calc Af Amer: >90 mL/min (ref >90)
Glucose, Bld: 116 mg/dL — ABNORMAL HIGH (ref 70–99)
POTASSIUM: 3.1 mmol/L — AB (ref 3.5–5.1)
SODIUM: 135 mmol/L (ref 135–145)
Total Protein: 6 g/dL (ref 6.0–8.3)

## 2015-03-06 LAB — CBC
HEMATOCRIT: 36.4 % (ref 36.0–46.0)
Hemoglobin: 11.7 g/dL — ABNORMAL LOW (ref 12.0–15.0)
MCH: 29.8 pg (ref 26.0–34.0)
MCHC: 32.1 g/dL (ref 30.0–36.0)
MCV: 92.6 fL (ref 78.0–100.0)
Platelets: 159 10*3/uL (ref 150–400)
RBC: 3.93 MIL/uL (ref 3.87–5.11)
RDW: 14.9 % (ref 11.5–15.5)
WBC: 10.4 10*3/uL (ref 4.0–10.5)

## 2015-03-06 LAB — HEMOGLOBIN A1C
Hgb A1c MFr Bld: 5.5 % (ref 4.8–5.6)
Mean Plasma Glucose: 111 mg/dL

## 2015-03-06 MED ORDER — POTASSIUM CHLORIDE CRYS ER 20 MEQ PO TBCR
40.0000 meq | EXTENDED_RELEASE_TABLET | ORAL | Status: AC
  Administered 2015-03-06 (×2): 40 meq via ORAL
  Filled 2015-03-06 (×2): qty 2

## 2015-03-06 MED ORDER — POTASSIUM CHLORIDE 20 MEQ PO PACK
40.0000 meq | PACK | ORAL | Status: DC
Start: 2015-03-06 — End: 2015-03-06

## 2015-03-06 MED ORDER — SODIUM CHLORIDE 0.9 % IV SOLN
INTRAVENOUS | Status: AC
  Administered 2015-03-07: 01:00:00 via INTRAVENOUS

## 2015-03-06 NOTE — Progress Notes (Signed)
Pt states she does not require any assistance with home CPAP machine or mask application. RT added sterile water for humidification. RT will continue to monitor as needed.

## 2015-03-06 NOTE — Progress Notes (Signed)
Subjective: Amber Kline. Pt feels better than she did on admission. Continues to complain of intermittent subjective fevers and chills. Last documented fever of 100.8 at noon yesterday. Low appetite, but no nausea. Pain well controlled on medication.   Objective: Vital signs in last 24 hours: Filed Vitals:   03/05/15 2149 03/06/15 0515 03/06/15 0615 03/06/15 0616  BP: 110/53  104/55   Pulse: 114     Temp: 98.7 F (37.1 C)  100.2 F (37.9 C) 99.9 F (37.7 C)  TempSrc: Oral  Axillary Oral  Resp: 24  20   Height:      Weight:  222.716 kg (491 lb)    SpO2: 98%  100%    Weight change: 1.814 kg (4 lb)  Intake/Output Summary (Last 24 hours) at 03/06/15 0840 Last data filed at 03/06/15 0820  Gross per 24 hour  Intake    360 ml  Output   1100 ml  Net   -740 ml   BP 104/55 mmHg  Pulse 114  Temp(Src) 99.9 F (37.7 C) (Oral)  Resp 20  Ht 5\' 3"  (1.6 m)  Wt 222.716 kg (491 lb)  BMI 87.00 kg/m2  SpO2 100%  LMP  (LMP Unknown) General appearance: alert, cooperative and morbidly obese Lungs: clear to auscultation of anterior lung fields Heart: tachycardic, S1, S2 normal, no murmur, click, rub or gallop Abdomen: NABS, morbidly obese limiting exam Extremities: bilateral lower extremities with significant lymphedema and adipose tissue, multiple 0.5-2 cm non-inflammed nodules over legs bilaterally, RLE with decreased erythema to inner ankle to mid-inner thigh, retreating from marked pen line yesterday, warm to the touch, firm, no areas of fluctuance noted on exam, DP pulse present bilaterally   Lab Results: Basic Metabolic Panel:  Recent Labs  16/09/9602/21/16 0220 03/06/15 0615  NA 135 135  K 3.5 3.1*  CL 104 103  CO2 23 24  GLUCOSE 133* 116*  BUN 11 <5*  CREATININE 0.63 0.66  CALCIUM 8.1* 7.9*   Liver Function Tests:  Recent Labs  03/05/15 0016 03/06/15 0615  AST 23 22  ALT 20 25  ALKPHOS 38* 47  BILITOT 1.2 1.2  PROT 6.3 6.0  ALBUMIN 2.9* 2.5*   No results for input(s):  LIPASE, AMYLASE in the last 72 hours. No results for input(s): AMMONIA in the last 72 hours. CBC:  Recent Labs  03/04/15 1739  03/05/15 0220 03/06/15 0615  WBC 9.6  < > 14.8* 10.4  NEUTROABS 9.0*  --   --   --   HGB 14.6  < > 12.4 11.7*  HCT 44.1  < > 37.8 36.4  MCV 93.0  < > 92.2 92.6  PLT 189  < > 182 159  < > = values in this interval not displayed.   Recent Labs  03/05/15 0916 03/05/15 1145 03/05/15 1551  GLUCAP 115* 91 124*   Hemoglobin A1C:  Recent Labs  03/05/15 0220  HGBA1C 5.5   Micro Results: Recent Results (from the past 240 hour(s))  MRSA PCR Screening     Status: None   Collection Time: 03/04/15  9:58 PM  Result Value Ref Range Status   MRSA by PCR NEGATIVE NEGATIVE Final    Comment:        The GeneXpert MRSA Assay (FDA approved for NASAL specimens only), is one component of a comprehensive MRSA colonization surveillance program. It is not intended to diagnose MRSA infection nor to guide or monitor treatment for MRSA infections.    Studies/Results: [address]g Chest [address]  03/04/2015   CLINICAL DATA:  Cellulitis.  EXAM: PORTABLE CHEST - 1 VIEW  COMPARISON:  None.  FINDINGS: The heart size and mediastinal contours are within normal limits. Both lungs are clear. The visualized skeletal structures are unremarkable.  IMPRESSION: Normal exam.   Electronically Signed   By: Francene Boyers M.D.   On: 03/04/2015 19:45   Medications: I have reviewed the patient's current medications. Scheduled Meds: . diphenhydrAMINE  25 mg Intravenous Q8H  . enoxaparin (LOVENOX) injection  100 mg Subcutaneous Q24H  . piperacillin-tazobactam (ZOSYN)  IV  3.375 g Intravenous Q8H  . sodium chloride  3 mL Intravenous Q12H  . vancomycin  1,500 mg Intravenous Q8H   Continuous Infusions:  PRN Meds:.HYDROcodone-acetaminophen, ondansetron **OR** ondansetron (ZOFRAN) IV Assessment/Plan: Principal Problem:   Sepsis Active Problems:   Obstructive sleep apnea   Hyperglycemia    Morbid obesity   Cellulitis of right lower extremity  39 year old female with morbid obesity, OSA, and recurrent cellulitis who was admitted for cellulitis of the right lower extremity with accompanying sepsis.  Sepsis due to right lower extremity cellulitis: BP has improved with volume resuscitation but remains soft. She continues to be tachycardic. Lactic acidosis has cleared. Tmax 100.9 yesterday at noon. Leukocytosis improving, WBC today 10.4 from 14.8 yesterday.  - Continue with vancomycin, consider transition to oral antibiotics tomorrow  - Discontinue zosyn  -Continue with Vicodin for pain - Monitor on telemetry  -Continue with IV fluids at 100 cc/h and reevaluate tomorrow -There appears to be no focal areas of fluctuation or pus accumulation. Imaging may not be necessary.  Hyperglycemia: CBGs mildly elevated between 116s-130s. HgbA1C 5.5.   Morbid obesity: BMI of 84. Almost bedbound. She does have history of OSA. Going forward, she will need to reduce weight. Continue with CPAP as needed.  F/E/N: HH diet. Monitor and replace electrolytes as needed. Will give oral potassium and check BMP tomorrow.   VTE Ppx: Lovenox  CODE STATUS: Full code  Family Communication: discussed with the patient about plan of care and all questions have been answered.  Disposition: She will remain on the floor for now as her sepsis continues to improve.   This is a Psychologist, occupational Note.  The care of the patient was discussed with Dr. Mikey Bussing and the assessment and plan formulated with their assistance.  Please see their attached note for official documentation of the daily encounter.   LOS: 2 days   Fawn Kirk, Med Student 03/06/2015, 8:40 AM

## 2015-03-06 NOTE — Progress Notes (Signed)
Subjective: Patient reports overall she is feeling better, last documented fever is 100.8 at noon yesterday.  Reports pain is well controlled Objective: Vital signs in last 24 hours: Filed Vitals:   03/05/15 2149 03/06/15 0515 03/06/15 0615 03/06/15 0616  BP: 110/53  104/55   Pulse: 114     Temp: 98.7 F (37.1 C)  100.2 F (37.9 C) 99.9 F (37.7 C)  TempSrc: Oral  Axillary Oral  Resp: 24  20   Height:      Weight:  491 lb (222.716 kg)    SpO2: 98%  100%    Weight change: 4 lb (1.814 kg)  Intake/Output Summary (Last 24 hours) at 03/06/15 1117 Last data filed at 03/06/15 0915  Gross per 24 hour  Intake    600 ml  Output   1100 ml  Net   -500 ml   General: Morbidly obese female resting in bed HEENT: PERRL, EOMI, no scleral icterus Cardiac: RRR, no rubs, murmurs or gallops Pulm: CTAB of anterior lung fields Abd: obese, soft, nontender, nondistended, BS present Ext: lymphedema of bilateral legs, right remains warm and errythmatous but has regressed 3-4 cm from pen line. Neuro: alert and oriented X3, cranial nerves II-XII grossly intact  Lab Results: Basic Metabolic Panel:  Recent Labs Lab 03/05/15 0220 03/06/15 0615  NA 135 135  K 3.5 3.1*  CL 104 103  CO2 23 24  GLUCOSE 133* 116*  BUN 11 <5*  CREATININE 0.63 0.66  CALCIUM 8.1* 7.9*   Liver Function Tests:  Recent Labs Lab 03/05/15 0016 03/06/15 0615  AST 23 22  ALT 20 25  ALKPHOS 38* 47  BILITOT 1.2 1.2  PROT 6.3 6.0  ALBUMIN 2.9* 2.5*   No results for input(s): LIPASE, AMYLASE in the last 168 hours. No results for input(s): AMMONIA in the last 168 hours. CBC:  Recent Labs Lab 03/04/15 1739  03/05/15 0220 03/06/15 0615  WBC 9.6  < > 14.8* 10.4  NEUTROABS 9.0*  --   --   --   HGB 14.6  < > 12.4 11.7*  HCT 44.1  < > 37.8 36.4  MCV 93.0  < > 92.2 92.6  PLT 189  < > 182 159  < > = values in this interval not displayed. Cardiac Enzymes: No results for input(s): CKTOTAL, CKMB, CKMBINDEX,  TROPONINI in the last 168 hours. BNP: No results for input(s): PROBNP in the last 168 hours. D-Dimer: No results for input(s): DDIMER in the last 168 hours. CBG:  Recent Labs Lab 03/05/15 0916 03/05/15 1145 03/05/15 1551  GLUCAP 115* 91 124*   Hemoglobin A1C:  Recent Labs Lab 03/05/15 0220  HGBA1C 5.5   Micro Results: Recent Results (from the past 240 hour(s))  Blood Culture (routine x 2)     Status: None (Preliminary result)   Collection Time: 03/04/15  6:50 PM  Result Value Ref Range Status   Specimen Description BLOOD RIGHT HAND  Final   Special Requests BOTTLES DRAWN AEROBIC AND ANAEROBIC 5CC EA  Final   Culture   Final           BLOOD CULTURE RECEIVED NO GROWTH TO DATE CULTURE WILL BE HELD FOR 5 DAYS BEFORE ISSUING A FINAL NEGATIVE REPORT Performed at [facility]    Report Status PENDING  Incomplete  Blood Culture (routine x 2)     Status: None (Preliminary result)   Collection Time: 03/04/15  7:00 PM  Result Value Ref Range Status   Specimen  Description BLOOD LEFT FOREARM  Final   Special Requests BOTTLES DRAWN AEROBIC ONLY 5CC  Final   Culture   Final           BLOOD CULTURE RECEIVED NO GROWTH TO DATE CULTURE WILL BE HELD FOR 5 DAYS BEFORE ISSUING A FINAL NEGATIVE REPORT Performed at [facility]    Report Status PENDING  Incomplete  MRSA PCR Screening     Status: None   Collection Time: 03/04/15  9:58 PM  Result Value Ref Range Status   MRSA by PCR NEGATIVE NEGATIVE Final    Comment:        The GeneXpert MRSA Assay (FDA approved for NASAL specimens only), is one component of a comprehensive MRSA colonization surveillance program. It is not intended to diagnose MRSA infection nor to guide or monitor treatment for MRSA infections.    Studies/Results: Dg Chest Port 1 View  03/04/2015   CLINICAL DATA:  Cellulitis.  EXAM: PORTABLE CHEST - 1 VIEW  COMPARISON:  None.  FINDINGS: The heart size and mediastinal contours are within normal  limits. Both lungs are clear. The visualized skeletal structures are unremarkable.  IMPRESSION: Normal exam.   Electronically Signed   By: Francene BoyersJames  Maxwell M.D.   On: 03/04/2015 19:45   Medications: I have reviewed the patient's current medications. Scheduled Meds: . diphenhydrAMINE  25 mg Intravenous Q8H  . enoxaparin (LOVENOX) injection  100 mg Subcutaneous Q24H  . potassium chloride  40 mEq Oral Q4H  . sodium chloride  3 mL Intravenous Q12H  . vancomycin  1,500 mg Intravenous Q8H   Continuous Infusions: . sodium chloride     PRN Meds:.HYDROcodone-acetaminophen, ondansetron **OR** ondansetron (ZOFRAN) IV Assessment/Plan:   Sepsis secondary to Cellulitis of right lower extremity - Afebrile for roughly 24 hours, clinically responding to broad specturm Abx.  Will deescalate her therapy today to IV Vancomycin alone (do not suspect pseudomonas).   - Likely can deescalate further to oral regimen tomorrow and possible discharge. - Borderline blood pressures without evidence of hypoperfusion overnight, will continue IVF today. - Will continue telemetry today.    Obstructive sleep apnea - CPAP QHS    Hyperglycemia - Likely secondary to acute illness, A1c 5.5    Morbid obesity - BMI of 84, needs weight loss   Dispo: Disposition is deferred at this time, awaiting improvement of current medical problems.  Anticipated discharge in approximately 1 day(s).   The patient does have a current PCP (Provider Default, MD) and [facility].  The patient does not have transportation limitations that hinder transportation to clinic appointments.  .Services Needed at time of discharge: Y = Yes, Blank = No PT:   OT:   RN:   Equipment:   Other:     LOS: 2 days   Gust RungErik C Jessika Rothery, DO 03/06/2015, 11:17 AM

## 2015-03-07 DIAGNOSIS — E876 Hypokalemia: Secondary | ICD-10-CM

## 2015-03-07 LAB — BASIC METABOLIC PANEL
Anion gap: 4 — ABNORMAL LOW (ref 5–15)
BUN: <5 mg/dL — ABNORMAL LOW (ref 6–23)
CO2: 26 mmol/L (ref 19–32)
CREATININE: 0.56 mg/dL (ref 0.50–1.10)
Calcium: 7.8 mg/dL — ABNORMAL LOW (ref 8.4–10.5)
Chloride: 106 mmol/L (ref 96–112)
GLUCOSE: 107 mg/dL — AB (ref 70–99)
Potassium: 3.4 mmol/L — ABNORMAL LOW (ref 3.5–5.1)
Sodium: 136 mmol/L (ref 135–145)

## 2015-03-07 LAB — CBC
HCT: 33.6 % — ABNORMAL LOW (ref 36.0–46.0)
HEMOGLOBIN: 10.8 g/dL — AB (ref 12.0–15.0)
MCH: 29.8 pg (ref 26.0–34.0)
MCHC: 32.1 g/dL (ref 30.0–36.0)
MCV: 92.6 fL (ref 78.0–100.0)
PLATELETS: 161 10*3/uL (ref 150–400)
RBC: 3.63 MIL/uL — ABNORMAL LOW (ref 3.87–5.11)
RDW: 15 % (ref 11.5–15.5)
WBC: 8.2 10*3/uL (ref 4.0–10.5)

## 2015-03-07 MED ORDER — CEPHALEXIN 500 MG PO CAPS: 500.0000 mg | ORAL_CAPSULE | Freq: Four times a day (QID) | ORAL | Status: DC

## 2015-03-07 MED ORDER — DOXYCYCLINE HYCLATE 100 MG PO TABS
100.0000 mg | ORAL_TABLET | Freq: Two times a day (BID) | ORAL | Status: DC
  Administered 2015-03-07: 100 mg via ORAL
  Filled 2015-03-07 (×2): qty 1

## 2015-03-07 MED ORDER — POTASSIUM CHLORIDE CRYS ER 20 MEQ PO TBCR
40.0000 meq | EXTENDED_RELEASE_TABLET | ORAL | Status: AC
  Administered 2015-03-07 (×2): 40 meq via ORAL
  Filled 2015-03-07 (×2): qty 2

## 2015-03-07 MED ORDER — CEPHALEXIN 500 MG PO CAPS
500.0000 mg | ORAL_CAPSULE | Freq: Four times a day (QID) | ORAL | Status: DC
  Administered 2015-03-07: 500 mg via ORAL
  Filled 2015-03-07 (×4): qty 1

## 2015-03-07 MED ORDER — DOXYCYCLINE HYCLATE 100 MG PO TABS: 100.0000 mg | ORAL_TABLET | Freq: Two times a day (BID) | ORAL | Status: DC

## 2015-03-07 NOTE — Progress Notes (Addendum)
Subjective: Patient feeling much better, no fever overnight.   Objective: Vital signs in last 24 hours: Filed Vitals:   03/06/15 1332 03/06/15 1458 03/06/15 2159 03/07/15 0509  BP: 117/56  113/60 128/71  Pulse:  113 102   Temp: 98 F (36.7 C)  98.8 F (37.1 C) 98.4 F (36.9 C)  TempSrc: Oral  Oral Oral  Resp: 18  18 20   Height:      Weight:      SpO2: 79% 99% 95% 100%   Weight change:   Intake/Output Summary (Last 24 hours) at 03/07/15 1031 Last data filed at 03/07/15 0948  Gross per 24 hour  Intake   3480 ml  Output   1600 ml  Net   1880 ml   General: Morbidly obese female resting in bed HEENT: PERRL, EOMI, no scleral icterus Cardiac: RRR, no rubs, murmurs or gallops Pulm: CTAB Abd: obese, soft, nontender, nondistended, BS present Ext: lymphedema of bilateral legs, right remains warm and errythmatous but has continued to regress from admission markings Neuro: alert and oriented X3, cranial nerves II-XII grossly intact  Lab Results: Basic Metabolic Panel:  Recent Labs Lab 03/06/15 0615 03/07/15 0615  NA 135 136  K 3.1* 3.4*  CL 103 106  CO2 24 26  GLUCOSE 116* 107*  BUN <5* <5*  CREATININE 0.66 0.56  CALCIUM 7.9* 7.8*   Liver Function Tests:  Recent Labs Lab 03/05/15 0016 03/06/15 0615  AST 23 22  ALT 20 25  ALKPHOS 38* 47  BILITOT 1.2 1.2  PROT 6.3 6.0  ALBUMIN 2.9* 2.5*   No results for input(s): LIPASE, AMYLASE in the last 168 hours. No results for input(s): AMMONIA in the last 168 hours. CBC:  Recent Labs Lab 03/04/15 1739  03/06/15 0615 03/07/15 0615  WBC 9.6  < > 10.4 8.2  NEUTROABS 9.0*  --   --   --   HGB 14.6  < > 11.7* 10.8*  HCT 44.1  < > 36.4 33.6*  MCV 93.0  < > 92.6 92.6  PLT 189  < > 159 161  < > = values in this interval not displayed. Cardiac Enzymes: No results for input(s): CKTOTAL, CKMB, CKMBINDEX, TROPONINI in the last 168 hours. BNP: No results for input(s): PROBNP in the last 168 hours. D-Dimer: No results  for input(s): DDIMER in the last 168 hours. CBG:  Recent Labs Lab 03/05/15 0916 03/05/15 1145 03/05/15 1551  GLUCAP 115* 91 124*   Hemoglobin A1C:  Recent Labs Lab 03/05/15 0220  HGBA1C 5.5   Micro Results: Recent Results (from the past 240 hour(s))  Blood Culture (routine x 2)     Status: None (Preliminary result)   Collection Time: 03/04/15  6:50 PM  Result Value Ref Range Status   Specimen Description BLOOD RIGHT HAND  Final   Special Requests BOTTLES DRAWN AEROBIC AND ANAEROBIC 5CC EA  Final   Culture   Final           BLOOD CULTURE RECEIVED NO GROWTH TO DATE CULTURE WILL BE HELD FOR 5 DAYS BEFORE ISSUING A FINAL NEGATIVE REPORT Performed at [facility]    Report Status PENDING  Incomplete  Blood Culture (routine x 2)     Status: None (Preliminary result)   Collection Time: 03/04/15  7:00 PM  Result Value Ref Range Status   Specimen Description BLOOD LEFT FOREARM  Final   Special Requests BOTTLES DRAWN AEROBIC ONLY 5CC  Final   Culture   Final  BLOOD CULTURE RECEIVED NO GROWTH TO DATE CULTURE WILL BE HELD FOR 5 DAYS BEFORE ISSUING A FINAL NEGATIVE REPORT Performed at [facility]    Report Status PENDING  Incomplete  MRSA PCR Screening     Status: None   Collection Time: 03/04/15  9:58 PM  Result Value Ref Range Status   MRSA by PCR NEGATIVE NEGATIVE Final    Comment:        The GeneXpert MRSA Assay (FDA approved for NASAL specimens only), is one component of a comprehensive MRSA colonization surveillance program. It is not intended to diagnose MRSA infection nor to guide or monitor treatment for MRSA infections.    Studies/Results: No results found. Medications: I have reviewed the patient's current medications. Scheduled Meds: . cephALEXin  500 mg Oral 4 times per day  . diphenhydrAMINE  25 mg Intravenous Q8H  . doxycycline  100 mg Oral Q12H  . enoxaparin (LOVENOX) injection  100 mg Subcutaneous Q24H  . potassium chloride   40 mEq Oral 6 times per day  . sodium chloride  3 mL Intravenous Q12H   Continuous Infusions:   PRN Meds:.HYDROcodone-acetaminophen, ondansetron **OR** ondansetron (ZOFRAN) IV Assessment/Plan:   Sepsis secondary to Cellulitis of right lower extremity - Afebrile for roughly 48 hours,has responded will to broad spectrum Abx and continued to respond to Vancomycin alone. - Patient stable for change to Oral Abx, given concern for possible MRSA and response to Vancomycin will Rx 10 additional day course of doxycyline, however she is still at risk for strep as cause and will also cover this with 10 day course of keflex.    Obstructive sleep apnea - CPAP QHS    Hyperglycemia - Likely secondary to acute illness, A1c 5.5    Morbid obesity - BMI of 84, needs weight loss, apparently her insurance does not cover gastric bypass...which is unfortunate.   Dispo: Discharge home today.  Will follow up with Dr. Earnest Bailey in 1 week.  The patient does have a current PCP (Macy Mis, MD) and does not need an [facility] hospital follow-up appointment after discharge.  The patient does not have transportation limitations that hinder transportation to clinic appointments.  .Services Needed at time of discharge: Y = Yes, Blank = No PT:   OT:   RN:   Equipment:   Other:     LOS: 3 days   Gust Rung, DO 03/07/2015, 10:31 AM

## 2015-03-07 NOTE — Discharge Instructions (Signed)
Please take Keflex 500mg  four times a day and doxycycline 100mg  twice a day for you leg cellulitis.  I want you to follow up with your PCP early next week.

## 2015-03-07 NOTE — Progress Notes (Signed)
Nsg Discharge Note  Admit Date:  03/04/2015 Discharge date: 03/07/2015   Deboraha SprangRuth Orozco-Martinez to be D/C'd Home per MD order.  AVS completed.  Copy for chart, and copy for patient signed, and dated. Patient/caregiver able to verbalize understanding.  Discharge Medication:   Medication List    TAKE these medications        cephALEXin 500 MG capsule  Commonly known as:  KEFLEX  Take 1 capsule (500 mg total) by mouth every 6 (six) hours.     doxycycline 100 MG tablet  Commonly known as:  VIBRA-TABS  Take 1 tablet (100 mg total) by mouth 2 (two) times daily.     ibuprofen 800 MG tablet  Commonly known as:  ADVIL,MOTRIN  Take 1 tablet (800 mg total) by mouth every 8 (eight) hours as needed for pain.     traMADol 50 MG tablet  Commonly known as:  ULTRAM  Take 1 tablet (50 mg total) by mouth every 6 (six) hours as needed for pain.        Discharge Assessment: Filed Vitals:   03/07/15 0509  BP: 128/71  Pulse:   Temp: 98.4 F (36.9 C)  Resp: 20   Skin clean, dry and intact without evidence of skin break down, no evidence of skin tears noted. IV catheter discontinued intact. Site without signs and symptoms of complications - no redness or edema noted at insertion site, patient denies c/o pain - only slight tenderness at site.  Dressing with slight pressure applied.  D/c Instructions-Education: Discharge instructions given to patient/family with verbalized understanding. D/c education completed with patient/family including follow up instructions, medication list, d/c activities limitations if indicated, with other d/c instructions as indicated by MD - patient able to verbalize understanding, all questions fully answered. Patient instructed to return to ED, call 911, or call MD for any changes in condition.  Patient escorted via WC, and D/C home via private auto.  Kern ReapBrumagin, Wanya Bangura L, RN 03/07/2015 12:28 PM

## 2015-03-08 ENCOUNTER — Telehealth: Payer: Self-pay | Admitting: Internal Medicine

## 2015-03-08 MED ORDER — TRAMADOL HCL 50 MG PO TABS: 50.0000 mg | ORAL_TABLET | Freq: Four times a day (QID) | ORAL | Status: DC | PRN

## 2015-03-08 NOTE — Telephone Encounter (Signed)
   Reason for call:   I received a call from [name] 's brother Amber Kline at 3411 AM indicating his sister Amber Kline is still having some stinging pain in her left leg that has been refractory to the 800mg  Ibuprofen and she does not have any tramadol at home for her pain.   Pertinent Data:   Needs something from breakthrough pain, called PCP but out of office currently   Assessment / Plan / Recommendations:   Will Rx 30 tablets of Tramadol for breakthough pain, patient is morbidly obese and has difficulty getting her to pick up Rx.  Her brother Amber Kline works [facility] and can stop by to get Rx.  [facility]  If swelling, redness or pain gets worse, they should go to an urgent care facility or to to ER for further evaluation.   Gust RungErik C Armine Rizzolo, DO   03/08/2015, 10:59 AM

## 2015-03-10 ENCOUNTER — Inpatient Hospital Stay (HOSPITAL_COMMUNITY)
Admission: EM | Admit: 2015-03-10 | Discharge: 2015-03-13 | DRG: 603 | Disposition: A | Discharge status: Home / Self Care | Payer: Managed Care, Other (non HMO) | Attending: Internal Medicine | Admitting: Internal Medicine

## 2015-03-10 ENCOUNTER — Encounter (HOSPITAL_COMMUNITY): Payer: Self-pay | Admitting: *Deleted

## 2015-03-10 DIAGNOSIS — Z882 Allergy status to sulfonamides status: Secondary | ICD-10-CM

## 2015-03-10 DIAGNOSIS — G4733 Obstructive sleep apnea (adult) (pediatric): Secondary | ICD-10-CM | POA: Diagnosis present

## 2015-03-10 DIAGNOSIS — Z87891 Personal history of nicotine dependence: Secondary | ICD-10-CM | POA: Diagnosis not present

## 2015-03-10 DIAGNOSIS — Z9981 Dependence on supplemental oxygen: Secondary | ICD-10-CM

## 2015-03-10 DIAGNOSIS — I890 Lymphedema, not elsewhere classified: Secondary | ICD-10-CM | POA: Diagnosis present

## 2015-03-10 DIAGNOSIS — L03115 Cellulitis of right lower limb: Secondary | ICD-10-CM | POA: Diagnosis present

## 2015-03-10 DIAGNOSIS — L03119 Cellulitis of unspecified part of limb: Secondary | ICD-10-CM | POA: Diagnosis present

## 2015-03-10 DIAGNOSIS — Z6841 Body Mass Index (BMI) 40.0 and over, adult: Secondary | ICD-10-CM | POA: Diagnosis not present

## 2015-03-10 DIAGNOSIS — L02419 Cutaneous abscess of limb, unspecified: Secondary | ICD-10-CM | POA: Diagnosis present

## 2015-03-10 LAB — CBC WITH DIFFERENTIAL/PLATELET
BASOS ABS: 0.1 10*3/uL (ref 0.0–0.1)
Basophils Relative: 1 % (ref 0–1)
EOS ABS: 0.2 10*3/uL (ref 0.0–0.7)
EOS PCT: 2 % (ref 0–5)
HCT: 37.1 % (ref 36.0–46.0)
Hemoglobin: 12.1 g/dL (ref 12.0–15.0)
LYMPHS ABS: 1.2 10*3/uL (ref 0.7–4.0)
Lymphocytes Relative: 11 % — ABNORMAL LOW (ref 12–46)
MCH: 30 pg (ref 26.0–34.0)
MCHC: 32.6 g/dL (ref 30.0–36.0)
MCV: 92.1 fL (ref 78.0–100.0)
MONO ABS: 1 10*3/uL (ref 0.1–1.0)
MONOS PCT: 9 % (ref 3–12)
Neutro Abs: 8.2 10*3/uL — ABNORMAL HIGH (ref 1.7–7.7)
Neutrophils Relative %: 77 % (ref 43–77)
PLATELETS: 293 10*3/uL (ref 150–400)
RBC: 4.03 MIL/uL (ref 3.87–5.11)
RDW: 14.7 % (ref 11.5–15.5)
WBC: 10.7 10*3/uL — ABNORMAL HIGH (ref 4.0–10.5)

## 2015-03-10 LAB — BASIC METABOLIC PANEL
Anion gap: 7 (ref 5–15)
BUN: 5 mg/dL — AB (ref 6–23)
CHLORIDE: 99 mmol/L (ref 96–112)
CO2: 30 mmol/L (ref 19–32)
Calcium: 8.9 mg/dL (ref 8.4–10.5)
Creatinine, Ser: 0.59 mg/dL (ref 0.50–1.10)
GFR calc non Af Amer: >90 mL/min (ref >90)
Glucose, Bld: 115 mg/dL — ABNORMAL HIGH (ref 70–99)
Potassium: 4.1 mmol/L (ref 3.5–5.1)
Sodium: 136 mmol/L (ref 135–145)

## 2015-03-10 LAB — SEDIMENTATION RATE: Sed Rate: 127 mm/hr — ABNORMAL HIGH (ref 0–22)

## 2015-03-10 LAB — I-STAT CG4 LACTIC ACID, ED: Lactic Acid, Venous: 1.24 mmol/L (ref 0.5–2.0)

## 2015-03-10 MED ORDER — VANCOMYCIN HCL 10 G IV SOLR
1500.0000 mg | Freq: Three times a day (TID) | INTRAVENOUS | Status: DC
  Administered 2015-03-10: 1500 mg via INTRAVENOUS
  Filled 2015-03-10 (×2): qty 1500

## 2015-03-10 MED ORDER — VANCOMYCIN HCL 10 G IV SOLR
1500.0000 mg | Freq: Three times a day (TID) | INTRAVENOUS | Status: DC
  Administered 2015-03-11 (×3): 1500 mg via INTRAVENOUS
  Filled 2015-03-10 (×5): qty 1500

## 2015-03-10 MED ORDER — TRAMADOL HCL 50 MG PO TABS: 50.0000 mg | ORAL_TABLET | Freq: Four times a day (QID) | ORAL | Status: DC | PRN

## 2015-03-10 MED ORDER — DIPHENHYDRAMINE HCL 50 MG/ML IJ SOLN
25.0000 mg | Freq: Four times a day (QID) | INTRAMUSCULAR | Status: DC | PRN
  Administered 2015-03-10 – 2015-03-12 (×8): 25 mg via INTRAVENOUS
  Filled 2015-03-10 (×8): qty 1

## 2015-03-10 MED ORDER — ENOXAPARIN SODIUM 100 MG/ML ~~LOC~~ SOLN
100.0000 mg | SUBCUTANEOUS | Status: DC
  Administered 2015-03-10 – 2015-03-12 (×3): 100 mg via SUBCUTANEOUS
  Filled 2015-03-10 (×5): qty 1

## 2015-03-10 MED ORDER — VANCOMYCIN HCL 10 G IV SOLR
2500.0000 mg | Freq: Once | INTRAVENOUS | Status: DC
  Filled 2015-03-10: qty 2500

## 2015-03-10 MED ORDER — VANCOMYCIN HCL 10 G IV SOLR
2500.0000 mg | Freq: Once | INTRAVENOUS | Status: AC
  Administered 2015-03-10: 2500 mg via INTRAVENOUS
  Filled 2015-03-10: qty 2500

## 2015-03-10 NOTE — ED Provider Notes (Signed)
CSN: [id number]     Arrival date & time 03/10/15  0050 History   First MD Initiated Contact with Patient 03/10/15 0151     This chart was scribed for Amber Boozeavid Keerstin Bjelland, MD by Arlan OrganAshley Leger, ED Scribe. This patient was seen in room A12C/A12C and the patient's care was started 2:03 AM.   Chief Complaint  Patient presents with  . Cellulitis   The history is provided by the patient. No language interpreter was used.    HPI Comments: Amber Kline is a 39 y.o. female who presents to the Emergency Department complaining of constant, ongoing, worsening cellulitis to RLE this evening. Pt states pain to area has worsened in last 24 hours. She has also noted a purple discoloration of bordered area. [name] was seen 3/20 for redness and swelling to RLE and was diagnosed with recurrent cellulitis. At that time pt was was admitted for IV antibiotics, sepsis, and lactic acid of 3.9. Currently she denies any fever or chills.  Past Medical History  Diagnosis Date  . Cellulitis ~ 2005; 03/2011; 07/2011; 07/30/12    LLE: LLE: RLE/pt; RLE "both times I have been hospitalized it's been for the right leg"  . Dysrhythmia 07/30/2012    tachycardia  . PONV (postoperative nausea and vomiting)   . On home O2 07/30/2012    "sleep w/2L @ night"  . OSA (obstructive sleep apnea)     "severe; sleep w/oxygen and BiPAP"   Past Surgical History  Procedure Laterality Date  . Hysteroscopy w/d&c  2006    for uterine polyp   No family history on file. History  Substance Use Topics  . Smoking status: Former Smoker -- 4 years    Types: Cigarettes    Quit date: 08/15/2009  . Smokeless tobacco: Never Used     Comment: 07/30/2012 "only smoked 3-4 cigarettes on the weekends when I was smoking"  . Alcohol Use: No     Comment: 07/30/2012 "have drank; never frequent; last drink ~ 2001"   OB History    No data available     Review of Systems  Constitutional: Negative for fever and chills.  Musculoskeletal:  Positive for arthralgias.  Skin: Positive for color change.  All other systems reviewed and are negative.     Allergies  Bactrim; Sulfa antibiotics; and Vancomycin  Home Medications   Prior to Admission medications   Medication Sig Start Date End Date Taking? Authorizing Provider  cephALEXin (KEFLEX) 500 MG capsule Take 1 capsule (500 mg total) by mouth every 6 (six) hours. 03/07/15   Gust RungErik C Hoffman, DO  doxycycline (VIBRA-TABS) 100 MG tablet Take 1 tablet (100 mg total) by mouth 2 (two) times daily. 03/07/15   Gust RungErik C Hoffman, DO  ibuprofen (ADVIL,MOTRIN) 800 MG tablet Take 1 tablet (800 mg total) by mouth every 8 (eight) hours as needed for pain. 10/10/12   Adlih Moreno-Coll, MD  traMADol (ULTRAM) 50 MG tablet Take 1 tablet (50 mg total) by mouth every 6 (six) hours as needed. 03/08/15   Gust RungErik C Hoffman, DO   Triage Vitals: BP 158/91 mmHg  Pulse 110  Temp(Src) 99 F (37.2 C) (Oral)  Resp 18  SpO2 100%  LMP 03/07/2015   Physical Exam  Constitutional: She is oriented to person, place, and time. She appears well-developed and well-nourished. No distress.  Morbidly obese  HENT:  Head: Normocephalic and atraumatic.  Eyes: EOM are normal.  Neck: Normal range of motion.  Cardiovascular: Normal rate, regular rhythm and normal heart  sounds.   Pulmonary/Chest: Effort normal and breath sounds normal.  Abdominal: Soft. She exhibits no distension. There is no tenderness.  Musculoskeletal: Normal range of motion.  R leg has scattered areas of erythema and warmth with violaceous area posterior laterally   Neurological: She is alert and oriented to person, place, and time.  Skin: Skin is warm and dry.  Psychiatric: She has a normal mood and affect. Judgment normal.  Nursing note and vitals reviewed.   ED Course  Procedures (including critical care time)  DIAGNOSTIC STUDIES: Oxygen Saturation is 100% on RA, Normal by my interpretation.    COORDINATION OF CARE: 2:10 AM-Discussed  treatment plan with pt at bedside and pt agreed to plan.     Labs Review Results for orders placed or performed during the hospital encounter of 03/10/15  CBC with Differential  Result Value Ref Range   WBC 10.7 (H) 4.0 - 10.5 K/uL   RBC 4.03 3.87 - 5.11 MIL/uL   Hemoglobin 12.1 12.0 - 15.0 g/dL   HCT 16.1 09.6 - 04.5 %   MCV 92.1 78.0 - 100.0 fL   MCH 30.0 26.0 - 34.0 pg   MCHC 32.6 30.0 - 36.0 g/dL   RDW 40.9 81.1 - 91.4 %   Platelets 293 150 - 400 K/uL   Neutrophils Relative % PENDING 43 - 77 %   Neutro Abs PENDING 1.7 - 7.7 K/uL   Band Neutrophils PENDING 0 - 10 %   Lymphocytes Relative PENDING 12 - 46 %   Lymphs Abs PENDING 0.7 - 4.0 K/uL   Monocytes Relative PENDING 3 - 12 %   Monocytes Absolute PENDING 0.1 - 1.0 K/uL   Eosinophils Relative PENDING 0 - 5 %   Eosinophils Absolute PENDING 0.0 - 0.7 K/uL   Basophils Relative PENDING 0 - 1 %   Basophils Absolute PENDING 0.0 - 0.1 K/uL   WBC Morphology PENDING    RBC Morphology PENDING    Smear Review PENDING    nRBC PENDING 0 /100 WBC   Metamyelocytes Relative PENDING %   Myelocytes PENDING %   Promyelocytes Absolute PENDING %   Blasts PENDING %  Basic metabolic panel  Result Value Ref Range   Sodium 136 135 - 145 mmol/L   Potassium 4.1 3.5 - 5.1 mmol/L   Chloride 99 96 - 112 mmol/L   CO2 30 19 - 32 mmol/L   Glucose, Bld 115 (H) 70 - 99 mg/dL   BUN 5 (L) 6 - 23 mg/dL   Creatinine, Ser 7.82 0.50 - 1.10 mg/dL   Calcium 8.9 8.4 - 95.6 mg/dL   GFR calc non Af Amer >90 >90 mL/min   GFR calc Af Amer >90 >90 mL/min   Anion gap 7 5 - 15  Sedimentation rate  Result Value Ref Range   Sed Rate 127 (H) 0 - 22 mm/hr  I-Stat CG4 Lactic Acid, ED  Result Value Ref Range   Lactic Acid, Venous 1.24 0.5 - 2.0 mmol/L     MDM   Final diagnoses:  Cellulitis of right lower leg  Morbid obesity    Cellulitis which is definitely not improving and does seem to be worsening on oral treatment at home. WBC has increased over what  it was in the hospital emphasis differential still pending). Consultation is obtained with internal medicine teaching service who took care of patient in the hospital. Of note, lactic acid level is normal today and had been increased when she had been admitted recently.  WBC is only mildly elevated but sedimentation rate is markedly elevated. Consultation has been obtained with internal medicine teaching service who will evaluate the patient for possible readmission.  I personally performed the services described in this documentation, which was scribed in my presence. The recorded information has been reviewed and is accurate.      Amber Booze, MD 03/10/15 [phone]

## 2015-03-10 NOTE — ED Notes (Signed)
The pt was just seen on Monday and admitted for rt lower leg  Cellulitis.  Today she is reporting an increase inh redness and pain

## 2015-03-10 NOTE — H&P (Signed)
Date: 03/10/2015               Patient Name:  Amber SprangRuth Kline MRN: [id number]  DOB: January 24, 1976 Age / Sex: 39 y.o., female   PCP: Macy MisKim K Briscoe, MD         Medical Service: Internal Medicine Teaching Service         Attending Physician: Dr. Dr. Josem KaufmannKlima    First Contact: Dr. Gara Kroneriana Ariellah Faust Pager: [phone]  Second Contact: Dr. Carlynn PurlErik Hoffman Pager: [phone]       After Hours (After 5p/  First Contact Pager: [phone]  weekends / holidays): Second Contact Pager: [phone]   Chief Complaint: rt leg cellulitis   History of Present Illness: [name] is a 39 year old female with morbid obesity, obstructive sleep apnea, and history of recurrent cellulitis who presented to the ED with right leg swelling, redness.  She was recently hospitalized earlier this week and was discharged 3 days ago on a 10 day course of Keflex and doxycycline which she reports she has not missed a dose. She presented to her PCPs office 2 days ago at which time they took pictures and she was sent home. When she took a picture of her rt lower extremity, she noticed an area along her lateral surface with increased purplish induration. As it was indicated on her discharge paperwork to present to the hospital again with any change in color, she presented to the ED just to be safe. During this time interval, she has not been mobile on her leg and has been lying on the couch though does report scratching it. She has a history of skin rash associated with sulfa drugs but does feel this new skin change is similar to that. She denies any fever, worsening pain, drainage, sun exposure, other skin changes, chest pain, shortness of breath, diarrhea.   Meds: Current Facility-Administered Medications  Medication Dose Route Frequency Provider Last Rate Last Dose  . diphenhydrAMINE (BENADRYL) injection 25 mg  25 mg Intravenous Q6H PRN Gust RungErik C Hoffman, DO       Current Outpatient Prescriptions  Medication Sig Dispense Refill  .  cephALEXin (KEFLEX) 500 MG capsule Take 1 capsule (500 mg total) by mouth every 6 (six) hours. 40 capsule 0  . doxycycline (VIBRA-TABS) 100 MG tablet Take 1 tablet (100 mg total) by mouth 2 (two) times daily. 20 tablet 0  . ibuprofen (ADVIL,MOTRIN) 200 MG tablet Take 400 mg by mouth every 6 (six) hours as needed for moderate pain.    . traMADol (ULTRAM) 50 MG tablet Take 1 tablet (50 mg total) by mouth every 6 (six) hours as needed. 30 tablet 0  . ibuprofen (ADVIL,MOTRIN) 800 MG tablet Take 1 tablet (800 mg total) by mouth every 8 (eight) hours as needed for pain. (Patient not taking: Reported on 03/10/2015) 21 tablet 0    Allergies: Allergies as of 03/10/2015 - Review Complete 03/10/2015  Allergen Reaction Noted  . Bactrim [sulfamethoxazole-trimethoprim]  03/04/2015  . Sulfa antibiotics  03/04/2015  . Vancomycin Hives 03/04/2015   Past Medical History  Diagnosis Date  . Cellulitis ~ 2005; 03/2011; 07/2011; 07/30/12    LLE: LLE: RLE/pt; RLE "both times I have been hospitalized it's been for the right leg"  . Dysrhythmia 07/30/2012    tachycardia  . PONV (postoperative nausea and vomiting)   . On home O2 07/30/2012    "sleep w/2L @ night"  . OSA (obstructive sleep apnea)     "severe; sleep w/oxygen and BiPAP"   Past  Surgical History  Procedure Laterality Date  . Hysteroscopy w/d&c  2006    for uterine polyp   No family history on file. History   Social History  . Marital Status: Legally Separated    Spouse Name: N/A  . Number of Children: N/A  . Years of Education: N/A   Occupational History  . Not on file.   Social History Main Topics  . Smoking status: Former Smoker -- 4 years    Types: Cigarettes    Quit date: 08/15/2009  . Smokeless tobacco: Never Used     Comment: 07/30/2012 "only smoked 3-4 cigarettes on the weekends when I was smoking"  . Alcohol Use: No     Comment: 07/30/2012 "have drank; never frequent; last drink ~ 2001"  . Drug Use: No  . Sexual Activity: Yes    Other Topics Concern  . Not on file   Social History Narrative    Review of Systems: Pertinent items are noted in HPI.  Physical Exam: Blood pressure 133/66, pulse 96, temperature 99 F (37.2 C), temperature source Oral, resp. rate 16, last menstrual period 03/07/2015, SpO2 100 %. General: Morbidly obese Caucasian female, NAD Cardiac: RRR, no rubs, murmurs or gallops though difficult to auscultate given large body habitus Pulm: CTAB, no wheezes, rales, or rhonchi Abd: soft, nontender, nondistended, BS present Ext:   Rt calf more edematous than on previous admission, no open skin breaks or drainage, multiple nodules on b/l LE, 2 pinpoint pustules noted with a white center.  Neuro: responds to questions appropriately; moving all extremities freely  Lab results: Basic Metabolic Panel:  Recent Labs  16/10/96 0215  NA 136  K 4.1  CL 99  CO2 30  GLUCOSE 115*  BUN 5*  CREATININE 0.59  CALCIUM 8.9   CBC:  Recent Labs  03/10/15 0215  WBC 10.7*  NEUTROABS 8.2*  HGB 12.1  HCT 37.1  MCV 92.1  PLT 293     Assessment & Plan by Problem: Principal Problem:   Cellulitis of right lower extremity Active Problems:   Obstructive sleep apnea   Lymphedema   Morbid obesity  Cellulitis: Her symptoms appear consistent with her most recent episode of cellulitis. There appears to be interval worsening of cellulitis as noted by progression of the erythema and swelling from the line previously marked on admission, 03/04/15. Rt calf is also more edematous. Aside from tachycardia, she does not have fever, hypotension, increased respiratory rate which would be suggestive of septic shock though does have WBC 10.7, mildly elevated from 8.2 on day of discharge 3/23. Blood cultures and most recent hospitalization had no growth. Unclear if skin changes represent a drug reaction though she has been on Keflex and doxycycline before versus worsening of cellulitis as doxy and keflex should have  adequately covered for strep and MRSA. Lactic acid 1.24.  - admit to med surg - started on vanc, pretreat w/ benadryl  IV due to localized skin reaction that occurred during last admission. - ultram  q 6h prn for pain  Morbid obesity: Admission weight 491 pounds, BMI 87. A1c (5.5) on last admission unremarkable for type 2 diabetes.  Obstructive sleep apnea: Continue CPAP.  FEN-- reg diet  DVT ppx-- lovenox  Dispo: Disposition is deferred at this time, awaiting improvement of current medical problems. Anticipated discharge in approximately 1-2 day(s).   The patient does have a current PCP (Macy Mis, MD) and does not need an [facility] hospital follow-up appointment after discharge.  The patient  does not have transportation limitations that hinder transportation to clinic appointments.  Signed: Denton Brick, MD 03/10/2015, 11:12 AM

## 2015-03-10 NOTE — Progress Notes (Signed)
ANTIBIOTIC CONSULT NOTE - INITIAL  Pharmacy Consult:  Vancomycin Indication:  Cellulitis  Allergies  Allergen Reactions  . Bactrim [Sulfamethoxazole-Trimethoprim]     Hives   . Sulfa Antibiotics     Hives   . Vancomycin Hives    Can take as long as benadryl is given prior to administration    Patient Measurements: Height = 63 inches Weight = 222.7 kg  Vital Signs: Temp: 99 F (37.2 C) (03/26 0104) Temp Source: Oral (03/26 0104) BP: 136/70 mmHg (03/26 1130) Pulse Rate: 96 (03/26 1130)  Labs:  Recent Labs  03/10/15 0215  WBC 10.7*  HGB 12.1  PLT 293  CREATININE 0.59   Estimated Creatinine Clearance: 184.9 mL/min (by C-G formula based on Cr of 0.59). No results for input(s): VANCOTROUGH, VANCOPEAK, VANCORANDOM, GENTTROUGH, GENTPEAK, GENTRANDOM, TOBRATROUGH, TOBRAPEAK, TOBRARND, AMIKACINPEAK, AMIKACINTROU, AMIKACIN in the last 72 hours.   Microbiology: Recent Results (from the past 720 hour(s))  Blood Culture (routine x 2)     Status: None (Preliminary result)   Collection Time: 03/04/15  6:50 PM  Result Value Ref Range Status   Specimen Description BLOOD RIGHT HAND  Final   Special Requests BOTTLES DRAWN AEROBIC AND ANAEROBIC 5CC EA  Final   Culture   Final           BLOOD CULTURE RECEIVED NO GROWTH TO DATE CULTURE WILL BE HELD FOR 5 DAYS BEFORE ISSUING A FINAL NEGATIVE REPORT Performed at [facility]    Report Status PENDING  Incomplete  Blood Culture (routine x 2)     Status: None (Preliminary result)   Collection Time: 03/04/15  7:00 PM  Result Value Ref Range Status   Specimen Description BLOOD LEFT FOREARM  Final   Special Requests BOTTLES DRAWN AEROBIC ONLY 5CC  Final   Culture   Final           BLOOD CULTURE RECEIVED NO GROWTH TO DATE CULTURE WILL BE HELD FOR 5 DAYS BEFORE ISSUING A FINAL NEGATIVE REPORT Performed at [facility]    Report Status PENDING  Incomplete  MRSA PCR Screening     Status: None   Collection Time: 03/04/15   9:58 PM  Result Value Ref Range Status   MRSA by PCR NEGATIVE NEGATIVE Final    Comment:        The GeneXpert MRSA Assay (FDA approved for NASAL specimens only), is one component of a comprehensive MRSA colonization surveillance program. It is not intended to diagnose MRSA infection nor to guide or monitor treatment for MRSA infections.     Medical History: Past Medical History  Diagnosis Date  . Cellulitis ~ 2005; 03/2011; 07/2011; 07/30/12    LLE: LLE: RLE/pt; RLE "both times I have been hospitalized it's been for the right leg"  . Dysrhythmia 07/30/2012    tachycardia  . PONV (postoperative nausea and vomiting)   . On home O2 07/30/2012    "sleep w/2L @ night"  . OSA (obstructive sleep apnea)     "severe; sleep w/oxygen and BiPAP"      Assessment: 39 year old morbidly obese female with right leg cellulitis to start vancomycin.  Patient has a history of recurrent cellulitis.  Baseline labs reviewed.   Goal of Therapy:  Vancomycin trough level 10-15 mcg/ml   Plan:  - Vanc  IV x 1, then  IV Q8H - Benadryl PRN ordered by MD, to be given prior to vanc administration per allergy documentation - Monitor renal fxn closely, vanc trough tomorrow  Corlene Sabia D. Laney Potashang, PharmD, BCPS Pager:  [phone] 03/10/2015, 1:03 PM

## 2015-03-11 LAB — BASIC METABOLIC PANEL
Anion gap: 5 (ref 5–15)
BUN: <5 mg/dL — ABNORMAL LOW (ref 6–23)
CALCIUM: 8.3 mg/dL — AB (ref 8.4–10.5)
CO2: 25 mmol/L (ref 19–32)
Chloride: 104 mmol/L (ref 96–112)
Creatinine, Ser: 0.53 mg/dL (ref 0.50–1.10)
Glucose, Bld: 101 mg/dL — ABNORMAL HIGH (ref 70–99)
POTASSIUM: 3.8 mmol/L (ref 3.5–5.1)
SODIUM: 134 mmol/L — AB (ref 135–145)

## 2015-03-11 LAB — CULTURE, BLOOD (ROUTINE X 2)
Culture: NO GROWTH
Culture: NO GROWTH

## 2015-03-11 LAB — CBC
HCT: 32 % — ABNORMAL LOW (ref 36.0–46.0)
Hemoglobin: 10.3 g/dL — ABNORMAL LOW (ref 12.0–15.0)
MCH: 30 pg (ref 26.0–34.0)
MCHC: 32.2 g/dL (ref 30.0–36.0)
MCV: 93.3 fL (ref 78.0–100.0)
Platelets: 273 10*3/uL (ref 150–400)
RBC: 3.43 MIL/uL — ABNORMAL LOW (ref 3.87–5.11)
RDW: 15 % (ref 11.5–15.5)
WBC: 7.6 10*3/uL (ref 4.0–10.5)

## 2015-03-11 NOTE — Progress Notes (Signed)
Subjective: Sleeping well w/ CPAP machine. Denies any complaints.   Objective: Vital signs in last 24 hours: Filed Vitals:   03/10/15 1230 03/10/15 1331 03/10/15 2010 03/11/15 0509  BP: 138/83 133/79 106/53 105/57  Pulse: 89 93 97 80  Temp:  98.9 F (37.2 C) 97.8 F (36.6 C) 98.6 F (37 C)  TempSrc:  Oral Oral Axillary  Resp:  SpO2: 98%  98% 97%   Weight change:   Intake/Output Summary (Last 24 hours) at 03/11/15 0839 Last data filed at 03/11/15 0300  Gross per 24 hour  Intake    600 ml  Output      0 ml  Net    600 ml   General: NAD, laying in bed comfortably w/ CPAP machine Lungs: clear on anterior auscultation Cardiac: RRR, no murmurs, difficult to auscultate due to body habitus  GI: soft, active bowel sounds, non TTP Neuro: CN II-XII grossly intact Ext: cellulitis has receded below skin markings, erythema has improved   Lab Results: Basic Metabolic Panel:  Recent Labs Lab 03/10/15 0215 03/11/15 0415  NA 136 134*  K 4.1 3.8  CL 99 104  CO2 30 25  GLUCOSE 115* 101*  BUN 5* <5*  CREATININE 0.59 0.53  CALCIUM 8.9 8.3*   Liver Function Tests:  Recent Labs Lab 03/05/15 0016 03/06/15 0615  AST 23 22  ALT 20 25  ALKPHOS 38* 47  BILITOT 1.2 1.2  PROT 6.3 6.0  ALBUMIN 2.9* 2.5*   CBC:  Recent Labs Lab 03/04/15 1739  03/10/15 0215 03/11/15 0415  WBC 9.6  < > 10.7* 7.6  NEUTROABS 9.0*  --  8.2*  --   HGB 14.6  < > 12.1 10.3*  HCT 44.1  < > 37.1 32.0*  MCV 93.0  < > 92.1 93.3  PLT 189  < > 293 273  < > = values in this interval not displayed. CBG:  Recent Labs Lab 03/05/15 0916 03/05/15 1145 03/05/15 1551  GLUCAP 115* 91 124*   Hemoglobin A1C:  Recent Labs Lab 03/05/15 0220  HGBA1C 5.5    Micro Results: Recent Results (from the past 240 hour(s))  Blood Culture (routine x 2)     Status: None   Collection Time: 03/04/15  6:50 PM  Result Value Ref Range Status   Specimen Description BLOOD RIGHT HAND  Final   Special  Requests BOTTLES DRAWN AEROBIC AND ANAEROBIC 5CC EA  Final   Culture   Final    NO GROWTH 5 DAYS Performed at [facility]    Report Status 03/11/2015 FINAL  Final  Blood Culture (routine x 2)     Status: None   Collection Time: 03/04/15  7:00 PM  Result Value Ref Range Status   Specimen Description BLOOD LEFT FOREARM  Final   Special Requests BOTTLES DRAWN AEROBIC ONLY 5CC  Final   Culture   Final    NO GROWTH 5 DAYS Performed at [facility]    Report Status 03/11/2015 FINAL  Final  MRSA PCR Screening     Status: None   Collection Time: 03/04/15  9:58 PM  Result Value Ref Range Status   MRSA by PCR NEGATIVE NEGATIVE Final    Comment:        The GeneXpert MRSA Assay (FDA approved for NASAL specimens only), is one component of a comprehensive MRSA colonization surveillance program. It is not intended to diagnose MRSA infection nor to guide or monitor treatment for  MRSA infections.    Studies/Results: No results found. Medications: I have reviewed the patient's current medications. Scheduled Meds: . enoxaparin (LOVENOX) injection  100 mg Subcutaneous Q24H  . vancomycin  1,500 mg Intravenous Q8H   Continuous Infusions:  PRN Meds:.diphenhydrAMINE, traMADol Assessment/Plan: Principal Problem:   Cellulitis of right lower extremity Active Problems:   Obstructive sleep apnea   Lymphedema   Morbid obesity   Cellulitis and abscess of leg   Cellulitis: cellulitis has significantly improved compared to admission.  - continue vanc, pretreat w/ benadryl 25mg  IV due to localized skin reaction that occurred during last admission. - can transition to po linezolid and discharge home today if able to arrange for patient to get linezolid. - ultram 50mg  q 6h prn for pain - case management consulted for medication assistance with linezolid   Obstructive sleep apnea: Continue CPAP.  FEN-- reg diet  DVT ppx-- lovenox  Dispo: Discharge pending linezolid  availability as outpatient.  The patient does have a current PCP (Macy MisKim K Briscoe, MD) and [facility] after discharge.  The patient does not have transportation limitations that hinder transportation to clinic appointments.  .Services Needed at time of discharge: Y = Yes, Blank = No PT:   OT:   RN:   Equipment:   Other:     LOS: 1 day   Denton Brickiana M Denys Labree, MD 03/11/2015, 8:39 AM

## 2015-03-11 NOTE — Progress Notes (Signed)
Patient to self-administer CPAP using her home machine.  Patient is familiar with equipment and procedure.  Humidifier filled.

## 2015-03-12 LAB — BASIC METABOLIC PANEL
Anion gap: 10 (ref 5–15)
BUN: 5 mg/dL — ABNORMAL LOW (ref 6–23)
CALCIUM: 8.9 mg/dL (ref 8.4–10.5)
CO2: 26 mmol/L (ref 19–32)
Chloride: 102 mmol/L (ref 96–112)
Creatinine, Ser: 0.54 mg/dL (ref 0.50–1.10)
GFR calc Af Amer: >90 mL/min (ref >90)
GFR calc non Af Amer: >90 mL/min (ref >90)
Glucose, Bld: 93 mg/dL (ref 70–99)
Potassium: 4 mmol/L (ref 3.5–5.1)
SODIUM: 138 mmol/L (ref 135–145)

## 2015-03-12 LAB — VANCOMYCIN, RANDOM: Vancomycin Rm: 14.8 ug/mL

## 2015-03-12 LAB — VANCOMYCIN, TROUGH: VANCOMYCIN TR: 31.8 ug/mL — AB (ref 10.0–20.0)

## 2015-03-12 MED ORDER — VANCOMYCIN HCL 10 G IV SOLR
1500.0000 mg | Freq: Two times a day (BID) | INTRAVENOUS | Status: DC
  Administered 2015-03-12 (×2): 1500 mg via INTRAVENOUS
  Filled 2015-03-12 (×4): qty 1500

## 2015-03-12 MED ORDER — VANCOMYCIN HCL 10 G IV SOLR
1500.0000 mg | Freq: Three times a day (TID) | INTRAVENOUS | Status: DC
  Filled 2015-03-12: qty 1500

## 2015-03-12 MED ORDER — LINEZOLID 600 MG PO TABS: 600.0000 mg | ORAL_TABLET | Freq: Two times a day (BID) | ORAL | Status: DC

## 2015-03-12 NOTE — Progress Notes (Signed)
ANTIBIOTIC CONSULT NOTE - Follow up  Pharmacy Consult:  Vancomycin Indication:  Cellulitis  Allergies  Allergen Reactions  . Bactrim [Sulfamethoxazole-Trimethoprim]     Hives   . Sulfa Antibiotics     Hives   . Vancomycin Hives    Can take as long as benadryl is given prior to administration    Patient Measurements: Height = 63 inches Weight = 222.7 kg  Vital Signs: Pulse Rate: 92 (03/27 2253)  Labs:  Recent Labs  03/10/15 0215 03/11/15 0415  WBC 10.7* 7.6  HGB 12.1 10.3*  PLT 293 273  CREATININE 0.59 0.53   Estimated Creatinine Clearance: 184.9 mL/min (by C-G formula based on Cr of 0.53).  Recent Labs  03/11/15 2313  VANCOTROUGH 31.8*     Microbiology: Recent Results (from the past 720 hour(s))  Blood Culture (routine x 2)     Status: None   Collection Time: 03/04/15  6:50 PM  Result Value Ref Range Status   Specimen Description BLOOD RIGHT HAND  Final   Special Requests BOTTLES DRAWN AEROBIC AND ANAEROBIC 5CC EA  Final   Culture   Final    NO GROWTH 5 DAYS Performed at [facility]    Report Status 03/11/2015 FINAL  Final  Blood Culture (routine x 2)     Status: None   Collection Time: 03/04/15  7:00 PM  Result Value Ref Range Status   Specimen Description BLOOD LEFT FOREARM  Final   Special Requests BOTTLES DRAWN AEROBIC ONLY 5CC  Final   Culture   Final    NO GROWTH 5 DAYS Performed at [facility]    Report Status 03/11/2015 FINAL  Final  MRSA PCR Screening     Status: None   Collection Time: 03/04/15  9:58 PM  Result Value Ref Range Status   MRSA by PCR NEGATIVE NEGATIVE Final    Comment:        The GeneXpert MRSA Assay (FDA approved for NASAL specimens only), is one component of a comprehensive MRSA colonization surveillance program. It is not intended to diagnose MRSA infection nor to guide or monitor treatment for MRSA infections.     Medical History: Past Medical History  Diagnosis Date  . Cellulitis ~ 2005;  03/2011; 07/2011; 07/30/12    LLE: LLE: RLE/pt; RLE "both times I have been hospitalized it's been for the right leg"  . Dysrhythmia 07/30/2012    tachycardia  . PONV (postoperative nausea and vomiting)   . On home O2 07/30/2012    "sleep w/2L @ night"  . OSA (obstructive sleep apnea)     "severe; sleep w/oxygen and BiPAP"      Assessment: 39 year old morbidly obese female with right leg cellulitis to start vancomycin.  Patient has a history of recurrent cellulitis.   Vancomycin trough = 31.8 mcg/ml.   The dose prior to this trough was given 2 hours late and appears that patient received 1.5g dose at 16:01 and 2.5 gm dose at 16:20 on 03/10/15.  Thus this trough result is not a true trough for 1gm IV q8h dosing.  Estimated true trough should be 12.5 on dose of 1gm q8h.  SCr 0.53, estimated CrCl >100 ml/min  Vancomycin dose just hung at 00:13 but I called RN ~11:20 and instructed RN to hold the vancomycin dose.  Goal of Therapy:  Vancomycin trough level 10-15 mcg/ml   Plan:  Hold vancomycin dose for now Recheck vancomycin random at 0700 - Benadryl PRN ordered by MD,  to be given prior to vanc administration per allergy documentation - Monitor renal fxn closely  Noah Delaine, RPh Clinical Pharmacist Pager: [phone] 03/12/2015, 12:39 AM

## 2015-03-12 NOTE — Progress Notes (Signed)
ANTIBIOTIC CONSULT NOTE - FOLLOW UP  Pharmacy Consult for Vancomycin Indication: Cellulitis  Allergies  Allergen Reactions  . Bactrim [Sulfamethoxazole-Trimethoprim]     Hives   . Sulfa Antibiotics     Hives   . Vancomycin Hives    Can take as long as benadryl is given prior to administration    Patient Measurements:   Adjusted Body Weight:    Vital Signs: Temp: 98.6 F (37 C) (03/28 0626) Temp Source: Axillary (03/28 0626) BP: 139/72 mmHg (03/28 0626) Pulse Rate: 80 (03/28 0626) Intake/Output from previous day: 03/27 0701 - 03/28 0700 In: 2340 [P.O.:840; IV Piggyback:1500] Out: -  Intake/Output from this shift:    Labs:  Recent Labs  03/10/15 0215 03/11/15 0415  WBC 10.7* 7.6  HGB 12.1 10.3*  PLT 293 273  CREATININE 0.59 0.53   Estimated Creatinine Clearance: 184.9 mL/min (by C-G formula based on Cr of 0.53).  Recent Labs  03/11/15 2313 03/12/15 0615  VANCOTROUGH 31.8*  --   VANCORANDOM  --  14.8     Microbiology: Recent Results (from the past 720 hour(s))  Blood Culture (routine x 2)     Status: None   Collection Time: 03/04/15  6:50 PM  Result Value Ref Range Status   Specimen Description BLOOD RIGHT HAND  Final   Special Requests BOTTLES DRAWN AEROBIC AND ANAEROBIC 5CC EA  Final   Culture   Final    NO GROWTH 5 DAYS Performed at [facility]    Report Status 03/11/2015 FINAL  Final  Blood Culture (routine x 2)     Status: None   Collection Time: 03/04/15  7:00 PM  Result Value Ref Range Status   Specimen Description BLOOD LEFT FOREARM  Final   Special Requests BOTTLES DRAWN AEROBIC ONLY 5CC  Final   Culture   Final    NO GROWTH 5 DAYS Performed at [facility]    Report Status 03/11/2015 FINAL  Final  MRSA PCR Screening     Status: None   Collection Time: 03/04/15  9:58 PM  Result Value Ref Range Status   MRSA by PCR NEGATIVE NEGATIVE Final    Comment:        The GeneXpert MRSA Assay (FDA approved for NASAL  specimens only), is one component of a comprehensive MRSA colonization surveillance program. It is not intended to diagnose MRSA infection nor to guide or monitor treatment for MRSA infections.     Anti-infectives    Start     Dose/Rate Route Frequency Ordered Stop   03/11/15 0000  vancomycin (VANCOCIN) 1,500 mg in sodium chloride 0.9 % 500 mL IVPB     1,500 mg 250 mL/hr over 120 Minutes Intravenous Every 8 hours 03/10/15 1613     03/10/15 2300  vancomycin (VANCOCIN) 1,500 mg in sodium chloride 0.9 % 500 mL IVPB  Status:  Discontinued     1,500 mg 250 mL/hr over 120 Minutes Intravenous Every 8 hours 03/10/15 1307 03/10/15 1614   03/10/15 1630  vancomycin (VANCOCIN) 2,500 mg in sodium chloride 0.9 % 500 mL IVPB     2,500 mg 250 mL/hr over 120 Minutes Intravenous  Once 03/10/15 1619 03/10/15 1820   03/10/15 1315  vancomycin (VANCOCIN) 2,500 mg in sodium chloride 0.9 % 500 mL IVPB  Status:  Discontinued     2,500 mg 250 mL/hr over 120 Minutes Intravenous  Once 03/10/15 1307 03/10/15 1619      Assessment: 39 YO morbidly obese female with right leg  cellulitis to start vancomycin. Patient has a history of recurrent cellulitis. Baseline labs reviewed.  Anticoagulation: on lovenox 100 mg sq q24h for VTE px (patient weighs 222.7 kg). Hgb 12.1>10.3. Plts 273 ok.  Infectious Disease: Vanc D#3 for right leg cellulitis, hx recurrent cellulitis - WBC down to 7.6. Has some local reaction with vancomycin. Patient may transition to linezolid PO and be discharged today if insurance pays for that.  Vanc 3/26 >> ** 3/27: VT 31.8, On 1.5 g q8h but only after 2 doses, On 3/26, Looks like she got both 1.5g :01and 2.5gm @ 16:20 on 3/26, Also the dose prior to VT was given 2h late, so VT not a true trough for this dose.  **VR: 14.8  Cardiovascular: VSS  Endocrinology: no hx DM, A1c 5.5%  Gastrointestinal / Nutrition: LFTs WNL  Nephrology: SCr 0.53, CrCL 150 ml/min  (normalized)  Pulmonary: OSA  Hematology / Oncology: Hgb 10.3 down and Plt 273 K  PTA Medication Issues: cephalexin  Best Practices: BMI = 87; lovenox   Goal of Therapy:  Vancomycin trough level 10-15 mcg/ml  Plan:  Change Vancomycin to  IV q12hrs. Dose due now.   Tecora Eustache S. Merilynn Finland, PharmD, BCPS Clinical Staff Pharmacist Pager [phone]  Misty Stanley Stillinger 03/12/2015,7:54 AM

## 2015-03-12 NOTE — Progress Notes (Addendum)
Subjective: Denies any complaints. Did state LLE itched when she ambulated to the restroom.    Objective: Vital signs in last 24 hours: Filed Vitals:   03/11/15 0509 03/11/15 0700 03/11/15 2253 03/12/15 0626  BP: 105/57 130/80  139/72  Pulse: 80 98 92 80  Temp: 98.6 F (37 C) 98.5 F (36.9 C)  98.6 F (37 C)  TempSrc: Axillary Oral  Axillary  Resp: 16  18   SpO2: 97% 97% 96% 99%   Weight change:   Intake/Output Summary (Last 24 hours) at 03/12/15 1100 Last data filed at 03/11/15 1824  Gross per 24 hour  Intake   1980 ml  Output      0 ml  Net   1980 ml   General: NAD,  Lungs: clear on anterior auscultation Cardiac: RRR, no murmurs, difficult to auscultate due to body habitus  GI: soft, active bowel sounds, non TTP Neuro: CN II-XII grossly intact Ext: cellulitis has receded below skin markings, erythema has improved compared to prior exam  Lab Results: Basic Metabolic Panel:  Recent Labs Lab 03/11/15 0415 03/12/15 0830  NA 134* 138  K 3.8 4.0  CL 104 102  CO2 25 26  GLUCOSE 101* 93  BUN <5* 5*  CREATININE 0.53 0.54  CALCIUM 8.3* 8.9   Liver Function Tests:  Recent Labs Lab 03/06/15 0615  AST 22  ALT 25  ALKPHOS 47  BILITOT 1.2  PROT 6.0  ALBUMIN 2.5*   CBC:  Recent Labs Lab 03/10/15 0215 03/11/15 0415  WBC 10.7* 7.6  NEUTROABS 8.2*  --   HGB 12.1 10.3*  HCT 37.1 32.0*  MCV 92.1 93.3  PLT 293 273   CBG:  Recent Labs Lab 03/05/15 1145 03/05/15 1551  GLUCAP 91 124*   Hemoglobin A1C: No results for input(s): HGBA1C in the last 168 hours.  Micro Results: Recent Results (from the past 240 hour(s))  Blood Culture (routine x 2)     Status: None   Collection Time: 03/04/15  6:50 PM  Result Value Ref Range Status   Specimen Description BLOOD RIGHT HAND  Final   Special Requests BOTTLES DRAWN AEROBIC AND ANAEROBIC 5CC EA  Final   Culture   Final    NO GROWTH 5 DAYS Performed at [facility]    Report Status 03/11/2015  FINAL  Final  Blood Culture (routine x 2)     Status: None   Collection Time: 03/04/15  7:00 PM  Result Value Ref Range Status   Specimen Description BLOOD LEFT FOREARM  Final   Special Requests BOTTLES DRAWN AEROBIC ONLY 5CC  Final   Culture   Final    NO GROWTH 5 DAYS Performed at [facility]    Report Status 03/11/2015 FINAL  Final  MRSA PCR Screening     Status: None   Collection Time: 03/04/15  9:58 PM  Result Value Ref Range Status   MRSA by PCR NEGATIVE NEGATIVE Final    Comment:        The GeneXpert MRSA Assay (FDA approved for NASAL specimens only), is one component of a comprehensive MRSA colonization surveillance program. It is not intended to diagnose MRSA infection nor to guide or monitor treatment for MRSA infections.    Studies/Results: No results found. Medications: I have reviewed the patient's current medications. Scheduled Meds: . enoxaparin (LOVENOX) injection  100 mg Subcutaneous Q24H  . vancomycin  1,500 mg Intravenous Q12H   Continuous Infusions:  PRN Meds:.diphenhydrAMINE, traMADol Assessment/Plan: Principal  Problem:   Cellulitis of right lower extremity Active Problems:   Obstructive sleep apnea   Lymphedema   Morbid obesity   Cellulitis and abscess of leg   Cellulitis: cellulitis has significantly improved compared to admission.  - continue vanc day 3, pretreat w/ benadryl  IV due to localized skin reaction that occurred during last admission. - can transition to po linezolid and discharge home today if able to arrange for patient to get linezolid. Other option is to get PICC line w/ vanc IV abx - ultram  q 6h prn for pain - spoke w/case management to assist with arranging linezolid  BID x 8 days as outpatient. Pt can be discharged as soon as this is arranged. Unfortunately case management was not available yesterday as pt was ready for discharge then.   Obstructive sleep apnea: Continue CPAP.  FEN-- reg diet  DVT  ppx-- lovenox  Dispo: Discharge pending linezolid availability as outpatient. CM consulted  The patient does have a current PCP (Macy Mis, MD) and does not need an [facility] hospital follow-up appointment after discharge.  The patient does not have transportation limitations that hinder transportation to clinic appointments.  .Services Needed at time of discharge: Y = Yes, Blank = No PT:   OT:   RN:   Equipment:   Other:     LOS: 2 days   Denton Brick, MD 03/12/2015, 11:00 AM

## 2015-03-12 NOTE — Discharge Instructions (Signed)
Start taking linezolid 600mg  twice a day for 8 more days.

## 2015-03-12 NOTE — Progress Notes (Signed)
Rt Note:  Patient has home CPAP unit.

## 2015-03-12 NOTE — Discharge Summary (Signed)
Name: Amber Kline MRN: [id number] DOB: 08/25/1976 39 y.o. PCP: Macy Mis, MD  Date of Admission: 03/10/2015  1:44 AM Date of Discharge: 03/12/2015 Attending Physician: Aletta Edouard, MD  Discharge Diagnosis: Principal Problem:   Cellulitis of right lower extremity Active Problems:   Obstructive sleep apnea   Lymphedema   Morbid obesity   Cellulitis and abscess of leg  Discharge Medications:   Medication List    STOP taking these medications        cephALEXin 500 MG capsule  Commonly known as:  KEFLEX     doxycycline 100 MG tablet  Commonly known as:  VIBRA-TABS      TAKE these medications        ibuprofen 200 MG tablet  Commonly known as:  ADVIL,MOTRIN  Take 400 mg by mouth every 6 (six) hours as needed for moderate pain.     ibuprofen 800 MG tablet  Commonly known as:  ADVIL,MOTRIN  Take 1 tablet (800 mg total) by mouth every 8 (eight) hours as needed for pain.     linezolid 600 MG tablet  Commonly known as:  ZYVOX  Take 1 tablet (600 mg total) by mouth 2 (two) times daily.     traMADol 50 MG tablet  Commonly known as:  ULTRAM  Take 1 tablet (50 mg total) by mouth every 6 (six) hours as needed.        Disposition and follow-up:   Amber Kline was discharged from [facility] in Good condition.  At the hospital follow up visit please address:  1.  Please ensure pt is complaint with linezolid  BID x 8 days.   2.  Labs / imaging needed at time of follow-up: none  3.  Pending labs/ test needing follow-up: none  Consultations:    Procedures Performed:  Dg Chest Port 1 View  03/04/2015   CLINICAL DATA:  Cellulitis.  EXAM: PORTABLE CHEST - 1 VIEW  COMPARISON:  None.  FINDINGS: The heart size and mediastinal contours are within normal limits. Both lungs are clear. The visualized skeletal structures are unremarkable.  IMPRESSION: Normal exam.   Electronically Signed   By: Francene Boyers M.D.   On: 03/04/2015 19:45      Admission HPI: Amber Kline is a 39 year old female with morbid obesity, obstructive sleep apnea, and history of recurrent cellulitis who presented to the ED with right leg swelling, redness.  She was recently hospitalized earlier this week and was discharged 3 days ago on a 10 day course of Keflex and doxycycline which she reports she has not missed a dose. She presented to her PCPs office 2 days ago at which time they took pictures and she was sent home. When she took a picture of her rt lower extremity, she noticed an area along her lateral surface with increased purplish induration. As it was indicated on her discharge paperwork to present to the hospital again with any change in color, she presented to the ED just to be safe. During this time interval, she has not been mobile on her leg and has been lying on the couch though does report scratching it. She has a history of skin rash associated with sulfa drugs but does feel this new skin change is similar to that. She denies any fever, worsening pain, drainage, sun exposure, other skin changes, chest pain, shortness of breath, diarrhea.    Hospital Course by problem list: Principal Problem:   Cellulitis of right lower extremity Active Problems:  Obstructive sleep apnea   Lymphedema   Morbid obesity   Cellulitis and abscess of leg   Cellulitis: Her symptoms appear consistent with her most recent episode of cellulitis. There appears to be interval worsening of cellulitis as noted by progression of the erythema and swelling from the line previously marked on admission, 03/04/15. Rt calf is also more edematous. Aside from tachycardia, she does not have fever, hypotension, increased respiratory rate which would be suggestive of septic shock though does have WBC 10.7, mildly elevated from 8.2 on day of discharge 3/23. Blood cultures and most recent hospitalization had no growth. Unclear if skin changes represent a drug reaction though she  has been on Keflex and doxycycline before versus worsening of cellulitis as doxy and keflex should have adequately covered for strep and MRSA. Lactic acid 1.24 on admission. Pt started on vanc and pretreated w/ benadryl 25mg  IV due to localized skin reaction that occurred during last admission. Cellulitis significantly improved the next day of admission. She was then discharged on linezolid 600mg  BID x 8 more days.   Discharge Vitals:   BP 139/72 mmHg  Pulse 80  Temp(Src) 98.6 F (37 C) (Axillary)  Resp 18  SpO2 99%  LMP 03/07/2015  Discharge Labs:  Results for orders placed or performed during the hospital encounter of 03/10/15 (from the past 24 hour(s))  Vancomycin, trough     Status: Abnormal   Collection Time: 03/11/15 11:13 PM  Result Value Ref Range   Vancomycin Tr 31.8 (HH) 10.0 - 20.0 ug/mL  Vancomycin, random     Status: None   Collection Time: 03/12/15  6:15 AM  Result Value Ref Range   Vancomycin Rm 14.8 ug/mL  Basic metabolic panel     Status: Abnormal   Collection Time: 03/12/15  8:30 AM  Result Value Ref Range   Sodium 138 135 - 145 mmol/L   Potassium 4.0 3.5 - 5.1 mmol/L   Chloride 102 96 - 112 mmol/L   CO2 26 19 - 32 mmol/L   Glucose, Bld 93 70 - 99 mg/dL   BUN 5 (L) 6 - 23 mg/dL   Creatinine, Ser 6.040.54 0.50 - 1.10 mg/dL   Calcium 8.9 8.4 - 54.010.5 mg/dL   GFR calc non Af Amer >90 >90 mL/min   GFR calc Af Amer >90 >90 mL/min   Anion gap 10 5 - 15    Signed: Denton Brickiana M Miriana Gaertner, MD 03/12/2015, 1:27 PM    Services Ordered on Discharge: none Equipment Ordered on Discharge: none

## 2015-03-13 NOTE — Progress Notes (Signed)
Pt requested to be discharged. Discharge forms signed. Patient is not under the influence of any pain medication. She was made aware of the dangers of immediately using her right leg to drive herself home. Her husband does not drive or speak [place].

## 2015-08-28 ENCOUNTER — Other Ambulatory Visit (HOSPITAL_COMMUNITY): Payer: Self-pay | Admitting: Obstetrics and Gynecology

## 2015-08-28 DIAGNOSIS — N912 Amenorrhea, unspecified: Secondary | ICD-10-CM

## 2015-09-04 ENCOUNTER — Ambulatory Visit (HOSPITAL_COMMUNITY)
Admission: RE | Admit: 2015-09-04 | Discharge: 2015-09-04 | Disposition: A | Discharge status: Home / Self Care | Payer: Managed Care, Other (non HMO) | Source: Ambulatory Visit | Attending: Obstetrics and Gynecology | Admitting: Obstetrics and Gynecology

## 2015-09-04 DIAGNOSIS — N912 Amenorrhea, unspecified: Secondary | ICD-10-CM | POA: Insufficient documentation

## 2015-10-03 ENCOUNTER — Other Ambulatory Visit: Payer: Self-pay | Admitting: Obstetrics and Gynecology

## 2015-11-03 ENCOUNTER — Other Ambulatory Visit (HOSPITAL_COMMUNITY): Payer: Self-pay | Admitting: Obstetrics and Gynecology

## 2015-11-03 NOTE — H&P (Signed)
Amber SprangRuth Orozco-Martinez is a 39 y.o.  female who presents for hysteroscopy because of  irregular bleeding .  Since 2000 the  patient's menstrual flow has been  irregular.  At times she may miss a menstrual period  for up to 9 months but then will bleed for another 3 months.  In 2006 the patient underwent removal of an endometrial polyp however, her irregular  bleeding pattern continued.  Just this year she bled for 6 weeks.  With any prolong bleeding the patient changes her pad 2- 5 times a day, will pass clots and has cramping that she rates 6/10. on a 10 point pain scale.   Cramping is occasionally relieved with Ibuprofen.  The patient has been given Provera for menstrual induction over the years but since she desires pregnancy, no on-going hormonal therapy has been given to regulate her period.  She denies and changes in bowel or bladder function, dyspareunia, lower back pain or dyspareunia.  A pelvic ultrasound in September 2016 showed a uterus: 9.1 x 3.9 x 4.7 cm, endometrium: 6 mm but neither ovary was seen.  Given the patient's persistent oligomenorrhea. the  thickness of endometrium after approximately 7 months of amenorrhea and desire to conceive,  the patient has  agreed to proceed with hysteroscopy, dilatation and curettage for further evaluation and management.  Past Medical History  OB History: G: 0  GYN History: menarche: 39 YO;    LMP: 08/25/15    Contracepton no method  The patient reports a past history of: chlamydia, gonorrhea and trichomonas.  Has a  history of abnormal PAP smear in 2001 that was repeated and returned normal.  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  Social History: Separated and Employed as a Librarian, academicCollections Represenattive;  Former smoker (quit 20 years ago) and denies alcohol use   Outpatient Encounter Prescriptions as of 11/03/2015  Medication Sig  . ibuprofen (ADVIL,MOTRIN) 200 MG tablet Take 400 mg by mouth every 6 (six) hours as needed for headache or mild pain.    No facility-administered encounter medications on file as of 11/03/2015.    Allergies  Allergen Reactions  . Bactrim [Sulfamethoxazole-Trimethoprim] Hives  . Sulfa Antibiotics Hives  . Vancomycin Hives    Can take as long as benadryl is given prior to administration    Denies sensitivity to peanuts, shellfish, soy, latex or adhesives.   ROS: Admits to constipation, recurrent lower extremity edema and cellulitis but  denies headache, vision changes, nasal congestion, dysphagia, tinnitus, dizziness, hoarseness, cough,  chest pain, shortness of breath, nausea, vomiting, diarrhea,  urinary frequency, urgency  dysuria, hematuria, vaginitis symptoms, pelvic pain, swelling of joints,easy bruising,  myalgias, arthralgias, skin rashes, unexplained weight loss and except as is mentioned in the history of present illness, patient's review of systems is otherwise negative.   Physical Exam  Bp: 124/82    P: 88     R: 16  Temperature:  98.5 degrees F orally  Weight: 480 lbs.  Height: 5\' 3"   BMI: 85  Neck: supple without masses or thyromegaly Lungs: clear to auscultation Heart: regular rate and rhythm Abdomen: soft, non-tender and no organomegaly Pelvic:EGBUS- wnl; vagina-normal rugae; uterus- appears normal size,  though exam limited by habitus, cervix without lesions or motion tenderness; adnexae-no tenderness or masses Extremities:  bilateral edema with skin changes consistent with chronic edema   Assesment: Amenorrhea                      Irregular Bleeding    Disposition:  A discussion was held with  patient regarding the indication for her procedure(s) along with the risks, which include but are not limited to: reaction to anesthesia, damage to adjacent organs, infection and excessive bleeding.  The patient verbalized understanding of these risks and has consented to proceed with Hysteroscopy,  Dilatation, Curettage at [facility] on November 15, 2015 at 11 a.m.   CSN# [id number]   Roseanne Juenger J. Lowell Guitar, PA-C  for Dr. Crist Fat. Rivard

## 2015-11-06 ENCOUNTER — Other Ambulatory Visit: Payer: Self-pay | Admitting: Obstetrics and Gynecology

## 2015-11-07 NOTE — Patient Instructions (Signed)
Your procedure is scheduled on:  Thursday, Dec. 1, 2016  [facility] at:  9:30 AM  Pick up the phone at the desk and dial [phone].  Call this number if you have problems the morning of surgery: [phone].  Remember: Do NOT eat food or drink after:  Midnight Wednesday, Nov. 30, 2016 Take these medicines the morning of surgery with a SIP OF WATER:  None  Do NOT wear jewelry (body piercing), metal hair clips/bobby pins, make-up, or nail polish. Do NOT wear lotions, powders, or perfumes.  You may wear deoderant. Do NOT shave for 48 hours prior to surgery. Do NOT bring valuables to the hospital. Contacts, dentures, or bridgework may not be worn into surgery.  Have a responsible adult drive you home and stay with you for 24 hours after your procedure

## 2015-11-09 ENCOUNTER — Encounter (HOSPITAL_COMMUNITY): Payer: Self-pay

## 2015-11-09 ENCOUNTER — Inpatient Hospital Stay (HOSPITAL_COMMUNITY): Admission: RE | Admit: 2015-11-09 | Payer: Managed Care, Other (non HMO) | Source: Ambulatory Visit

## 2015-11-09 ENCOUNTER — Encounter (HOSPITAL_COMMUNITY)
Admission: RE | Admit: 2015-11-09 | Discharge: 2015-11-09 | Disposition: A | Discharge status: Home / Self Care | Payer: Managed Care, Other (non HMO) | Source: Ambulatory Visit | Attending: Obstetrics and Gynecology | Admitting: Obstetrics and Gynecology

## 2015-11-09 DIAGNOSIS — N938 Other specified abnormal uterine and vaginal bleeding: Secondary | ICD-10-CM | POA: Insufficient documentation

## 2015-11-09 DIAGNOSIS — Z01818 Encounter for other preprocedural examination: Secondary | ICD-10-CM | POA: Insufficient documentation

## 2015-11-09 LAB — CBC
HCT: 40.9 % (ref 36.0–46.0)
HEMOGLOBIN: 13.2 g/dL (ref 12.0–15.0)
MCH: 29.3 pg (ref 26.0–34.0)
MCHC: 32.3 g/dL (ref 30.0–36.0)
MCV: 90.9 fL (ref 78.0–100.0)
Platelets: 233 10*3/uL (ref 150–400)
RBC: 4.5 MIL/uL (ref 3.87–5.11)
RDW: 14.7 % (ref 11.5–15.5)
WBC: 4.8 10*3/uL (ref 4.0–10.5)

## 2015-11-09 NOTE — Pre-Procedure Instructions (Signed)
Patient requested an anesthesia consult due to her severe sleep apnea and morbid obseity. Dr. Arby BarretteHatchett spoke with patient.

## 2015-11-15 ENCOUNTER — Encounter (HOSPITAL_COMMUNITY)
Admission: RE | Disposition: A | Discharge status: Home / Self Care | Payer: Self-pay | Source: Ambulatory Visit | Attending: Obstetrics and Gynecology

## 2015-11-15 ENCOUNTER — Ambulatory Visit (HOSPITAL_COMMUNITY)
Admission: RE | Admit: 2015-11-15 | Discharge: 2015-11-15 | Disposition: A | Discharge status: Home / Self Care | Payer: Managed Care, Other (non HMO) | Source: Ambulatory Visit | Attending: Obstetrics and Gynecology | Admitting: Obstetrics and Gynecology

## 2015-11-15 ENCOUNTER — Encounter (HOSPITAL_COMMUNITY): Payer: Self-pay

## 2015-11-15 ENCOUNTER — Ambulatory Visit (HOSPITAL_COMMUNITY): Payer: Managed Care, Other (non HMO) | Admitting: Anesthesiology

## 2015-11-15 DIAGNOSIS — N840 Polyp of corpus uteri: Secondary | ICD-10-CM | POA: Diagnosis not present

## 2015-11-15 DIAGNOSIS — Z6841 Body Mass Index (BMI) 40.0 and over, adult: Secondary | ICD-10-CM | POA: Insufficient documentation

## 2015-11-15 DIAGNOSIS — Z881 Allergy status to other antibiotic agents status: Secondary | ICD-10-CM | POA: Insufficient documentation

## 2015-11-15 DIAGNOSIS — Z882 Allergy status to sulfonamides status: Secondary | ICD-10-CM | POA: Diagnosis not present

## 2015-11-15 DIAGNOSIS — Z9981 Dependence on supplemental oxygen: Secondary | ICD-10-CM | POA: Insufficient documentation

## 2015-11-15 DIAGNOSIS — G4733 Obstructive sleep apnea (adult) (pediatric): Secondary | ICD-10-CM | POA: Diagnosis not present

## 2015-11-15 DIAGNOSIS — N938 Other specified abnormal uterine and vaginal bleeding: Secondary | ICD-10-CM | POA: Diagnosis present

## 2015-11-15 HISTORY — PX: DILATATION & CURRETTAGE/HYSTEROSCOPY WITH RESECTOCOPE: SHX5572

## 2015-11-15 LAB — HCG, SERUM, QUALITATIVE: PREG SERUM: NEGATIVE

## 2015-11-15 SURGERY — DILATATION & CURETTAGE/HYSTEROSCOPY WITH RESECTOCOPE
Anesthesia: General | Site: Uterus

## 2015-11-15 MED ORDER — ONDANSETRON HCL 4 MG/2ML IJ SOLN
INTRAMUSCULAR | Status: DC | PRN
  Administered 2015-11-15: 4 mg via INTRAVENOUS

## 2015-11-15 MED ORDER — LIDOCAINE HCL (CARDIAC) 20 MG/ML IV SOLN
INTRAVENOUS | Status: DC | PRN
  Administered 2015-11-15: 30 mg via INTRAVENOUS
  Administered 2015-11-15: 70 mg via INTRAVENOUS

## 2015-11-15 MED ORDER — SCOPOLAMINE 1 MG/3DAYS TD PT72
1.0000 | MEDICATED_PATCH | Freq: Once | TRANSDERMAL | Status: DC
  Administered 2015-11-15: 1.5 mg via TRANSDERMAL

## 2015-11-15 MED ORDER — LACTATED RINGERS IV SOLN
INTRAVENOUS | Status: DC
  Administered 2015-11-15: 75 mL/h via INTRAVENOUS

## 2015-11-15 MED ORDER — KETOROLAC TROMETHAMINE 30 MG/ML IJ SOLN: 30.0000 mg | Freq: Once | INTRAMUSCULAR | Status: DC | PRN

## 2015-11-15 MED ORDER — KETOROLAC TROMETHAMINE 30 MG/ML IJ SOLN
INTRAMUSCULAR | Status: AC
  Filled 2015-11-15: qty 1

## 2015-11-15 MED ORDER — GLYCOPYRROLATE 0.2 MG/ML IJ SOLN
INTRAMUSCULAR | Status: DC | PRN
  Administered 2015-11-15: 0.2 mg via INTRAVENOUS

## 2015-11-15 MED ORDER — SCOPOLAMINE 1 MG/3DAYS TD PT72
MEDICATED_PATCH | TRANSDERMAL | Status: AC
  Filled 2015-11-15: qty 1

## 2015-11-15 MED ORDER — ONDANSETRON HCL 4 MG/2ML IJ SOLN: 4.0000 mg | Freq: Once | INTRAMUSCULAR | Status: DC | PRN

## 2015-11-15 MED ORDER — FENTANYL CITRATE (PF) 100 MCG/2ML IJ SOLN
INTRAMUSCULAR | Status: AC
  Administered 2015-11-15: 25 ug via INTRAVENOUS
  Filled 2015-11-15: qty 2

## 2015-11-15 MED ORDER — MIDAZOLAM HCL 2 MG/2ML IJ SOLN
INTRAMUSCULAR | Status: AC
  Filled 2015-11-15: qty 2

## 2015-11-15 MED ORDER — FENTANYL CITRATE (PF) 100 MCG/2ML IJ SOLN
INTRAMUSCULAR | Status: DC | PRN
  Administered 2015-11-15 (×2): 50 ug via INTRAVENOUS

## 2015-11-15 MED ORDER — MEPERIDINE HCL 25 MG/ML IJ SOLN: 6.2500 mg | INTRAMUSCULAR | Status: DC | PRN

## 2015-11-15 MED ORDER — CHLOROPROCAINE HCL 1 % IJ SOLN
INTRAMUSCULAR | Status: AC
  Filled 2015-11-15: qty 30

## 2015-11-15 MED ORDER — CEFAZOLIN SODIUM 10 G IJ SOLR
3.0000 g | INTRAMUSCULAR | Status: AC
  Administered 2015-11-15: 3 g via INTRAVENOUS
  Filled 2015-11-15: qty 3000

## 2015-11-15 MED ORDER — CHLOROPROCAINE HCL 1 % IJ SOLN
INTRAMUSCULAR | Status: DC | PRN
  Administered 2015-11-15: 5 mL

## 2015-11-15 MED ORDER — KETOROLAC TROMETHAMINE 30 MG/ML IJ SOLN
INTRAMUSCULAR | Status: DC | PRN
  Administered 2015-11-15: 30 mg via INTRAVENOUS

## 2015-11-15 MED ORDER — DEXAMETHASONE SODIUM PHOSPHATE 4 MG/ML IJ SOLN
INTRAMUSCULAR | Status: AC
  Filled 2015-11-15: qty 1

## 2015-11-15 MED ORDER — SODIUM CHLORIDE 0.9 % IR SOLN
Status: DC | PRN
  Administered 2015-11-15: 3000 mL

## 2015-11-15 MED ORDER — FENTANYL CITRATE (PF) 100 MCG/2ML IJ SOLN
25.0000 ug | INTRAMUSCULAR | Status: DC | PRN
  Administered 2015-11-15 (×2): 25 ug via INTRAVENOUS

## 2015-11-15 MED ORDER — PROPOFOL 10 MG/ML IV BOLUS
INTRAVENOUS | Status: DC | PRN
  Administered 2015-11-15: 200 mg via INTRAVENOUS
  Administered 2015-11-15: 100 mg via INTRAVENOUS
  Administered 2015-11-15: 60 mg via INTRAVENOUS
  Administered 2015-11-15: 40 mg via INTRAVENOUS

## 2015-11-15 MED ORDER — FENTANYL CITRATE (PF) 100 MCG/2ML IJ SOLN
INTRAMUSCULAR | Status: AC
  Filled 2015-11-15: qty 2

## 2015-11-15 MED ORDER — DEXAMETHASONE SODIUM PHOSPHATE 10 MG/ML IJ SOLN
INTRAMUSCULAR | Status: DC | PRN
  Administered 2015-11-15: 4 mg via INTRAVENOUS

## 2015-11-15 MED ORDER — PROPOFOL 10 MG/ML IV BOLUS
INTRAVENOUS | Status: AC
  Filled 2015-11-15: qty 20

## 2015-11-15 MED ORDER — LIDOCAINE HCL (CARDIAC) 20 MG/ML IV SOLN
INTRAVENOUS | Status: AC
  Filled 2015-11-15: qty 10

## 2015-11-15 MED ORDER — PROPOFOL 10 MG/ML IV BOLUS
INTRAVENOUS | Status: AC
  Filled 2015-11-15: qty 40

## 2015-11-15 MED ORDER — ONDANSETRON HCL 4 MG/2ML IJ SOLN
INTRAMUSCULAR | Status: AC
  Filled 2015-11-15: qty 2

## 2015-11-15 MED ORDER — IBUPROFEN 600 MG PO TABS: 600.0000 mg | ORAL_TABLET | Freq: Four times a day (QID) | ORAL | Status: DC | PRN

## 2015-11-15 SURGICAL SUPPLY — 20 items
BOOTIES KNEE HIGH SLOAN (MISCELLANEOUS) ×6 IMPLANT
CANISTER SUCT 3000ML (MISCELLANEOUS) ×3 IMPLANT
CATH ROBINSON RED A/P 16FR (CATHETERS) ×3 IMPLANT
CLOTH BEACON ORANGE TIMEOUT ST (SAFETY) ×3 IMPLANT
CONTAINER PREFILL 10% NBF 60ML (FORM) ×6 IMPLANT
ELECT REM PT RETURN 9FT ADLT (ELECTROSURGICAL) ×3
ELECTRODE REM PT RTRN 9FT ADLT (ELECTROSURGICAL) ×1 IMPLANT
ELECTRODE ROLLER VERSAPOINT (ELECTRODE) IMPLANT
ELECTRODE RT ANGLE VERSAPOINT (CUTTING LOOP) ×3 IMPLANT
GLOVE BIOGEL PI IND STRL 7.0 (GLOVE) ×2 IMPLANT
GLOVE BIOGEL PI INDICATOR 7.0 (GLOVE) ×4
GOWN STRL REUS W/TWL LRG LVL3 (GOWN DISPOSABLE) ×6 IMPLANT
HOVERMATT HALF SINGLE USE (PATIENT TRANSFER) ×3 IMPLANT
LOOP ANGLED CUTTING 22FR (CUTTING LOOP) IMPLANT
PACK VAGINAL MINOR WOMEN LF (CUSTOM PROCEDURE TRAY) ×3 IMPLANT
PAD OB MATERNITY 4.3X12.25 (PERSONAL CARE ITEMS) ×3 IMPLANT
TOWEL OR 17X24 6PK STRL BLUE (TOWEL DISPOSABLE) ×3 IMPLANT
TUBING AQUILEX INFLOW (TUBING) ×3 IMPLANT
TUBING AQUILEX OUTFLOW (TUBING) ×3 IMPLANT
WATER STERILE IRR 1000ML POUR (IV SOLUTION) ×3 IMPLANT

## 2015-11-15 NOTE — H&P (View-Only) (Signed)
Amber SprangRuth Kline is a 39 y.o.  female who presents for hysteroscopy because of  irregular bleeding .  Since 2000 the  patient's menstrual flow has been  irregular.  At times she may miss a menstrual period  for up to 9 months but then will bleed for another 3 months.  In 2006 the patient underwent removal of an endometrial polyp however, her irregular  bleeding pattern continued.  Just this year she bled for 6 weeks.  With any prolong bleeding the patient changes her pad 2- 5 times a day, will pass clots and has cramping that she rates 6/10. on a 10 point pain scale.   Cramping is occasionally relieved with Ibuprofen.  The patient has been given Provera for menstrual induction over the years but since she desires pregnancy, no on-going hormonal therapy has been given to regulate her period.  She denies and changes in bowel or bladder function, dyspareunia, lower back pain or dyspareunia.  A pelvic ultrasound in September 2016 showed a uterus: 9.1 x 3.9 x 4.7 cm, endometrium: 6 mm but neither ovary was seen.  Given the patient's persistent oligomenorrhea. the  thickness of endometrium after approximately 7 months of amenorrhea and desire to conceive,  the patient has  agreed to proceed with hysteroscopy, dilatation and curettage for further evaluation and management.  Past Medical History  OB History: G: 0  GYN History: menarche: 39 YO;    LMP: 08/25/15    Contracepton no method  The patient reports a past history of: chlamydia, gonorrhea and trichomonas.  Has a  history of abnormal PAP smear in 2001 that was repeated and returned normal.  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  Social History: Separated and Employed as a Librarian, academicCollections Represenattive;  Former smoker (quit 20 years ago) and denies alcohol use   Outpatient Encounter Prescriptions as of 11/03/2015  Medication Sig  . ibuprofen (ADVIL,MOTRIN) 200 MG tablet Take 400 mg by mouth every 6 (six) hours as needed for headache or mild pain.    No facility-administered encounter medications on file as of 11/03/2015.    Allergies  Allergen Reactions  . Bactrim [Sulfamethoxazole-Trimethoprim] Hives  . Sulfa Antibiotics Hives  . Vancomycin Hives    Can take as long as benadryl is given prior to administration    Denies sensitivity to peanuts, shellfish, soy, latex or adhesives.   ROS: Admits to constipation, recurrent lower extremity edema and cellulitis but  denies headache, vision changes, nasal congestion, dysphagia, tinnitus, dizziness, hoarseness, cough,  chest pain, shortness of breath, nausea, vomiting, diarrhea,  urinary frequency, urgency  dysuria, hematuria, vaginitis symptoms, pelvic pain, swelling of joints,easy bruising,  myalgias, arthralgias, skin rashes, unexplained weight loss and except as is mentioned in the history of present illness, patient's review of systems is otherwise negative.   Physical Exam  Bp: 124/82    P: 88     R: 16  Temperature:  98.5 degrees F orally  Weight: 480 lbs.  Height: 5\' 3"   BMI: 85  Neck: supple without masses or thyromegaly Lungs: clear to auscultation Heart: regular rate and rhythm Abdomen: soft, non-tender and no organomegaly Pelvic:EGBUS- wnl; vagina-normal rugae; uterus- appears normal size,  though exam limited by habitus, cervix without lesions or motion tenderness; adnexae-no tenderness or masses Extremities:  bilateral edema with skin changes consistent with chronic edema   Assesment: Amenorrhea                      Irregular Bleeding    Disposition:  A discussion was held with  patient regarding the indication for her procedure(s) along with the risks, which include but are not limited to: reaction to anesthesia, damage to adjacent organs, infection and excessive bleeding.  The patient verbalized understanding of these risks and has consented to proceed with Hysteroscopy,  Dilatation, Curettage at [facility] on November 15, 2015 at 11 a.m.   CSN# [id number]   Kashif Pooler J. Miranda Frese, PA-C  for Dr. Sandra A. Rivard   

## 2015-11-15 NOTE — Discharge Instructions (Signed)
POST-OPERATIVE INSTRUCTIONS TO PATIENT  Call [facility]  [phone])  for excessive pain, bleeding or temperature greater than or equal to 100.4 degrees (orally).    No driving for 1 day No sexual activity for 1 week  Pain management:  Use Ibuprofen 600 mg every 6 hours for 5 days and then as needed. Use your pain medication as needed to maintain a pain level at or below 3/10 Use Colace 1-2 capsules per day as long as you are using pain medication to avoid constipation.       Diet: normal  Bathing: may shower day after surgery  Return to Dr. Estanislado Pandyivard on 11/29/15 as scheduled  Return to work: 11/19/15    [facility] Hospital[facility],SANDRA A MD 11/15/2015 12:26 PM    DISCHARGE INSTRUCTIONS: HYSTEROSCOPY  The following instructions have been prepared to help you care for yourself upon your return home.  May Remove Scop patch on or before 11/18/15  May take Ibuprofen after 6:00pm today.  May take stool softner while taking narcotic pain medication to prevent constipation.  Drink plenty of water.  Personal hygiene:  Use sanitary pads for vaginal drainage, not tampons.  Shower the day after your procedure.  NO tub baths, pools or Jacuzzis for 2-3 weeks.  Wipe front to back after using the bathroom.  Activity and limitations:  Do NOT drive or operate any equipment for 24 hours. The effects of anesthesia are still present and drowsiness may result.  Do NOT rest in bed all day.  Walking is encouraged.  Walk up and down stairs slowly.  You may resume your normal activity in one to two days or as indicated by your physician. Sexual activity: NO intercourse for at least 2 weeks after the procedure, or as indicated by your Doctor.  Diet: Eat a light meal as desired this evening. You may resume your usual diet tomorrow.  Return to Work: You may resume your work activities in one to two days or as indicated by Therapist, sportsyour Doctor.  What to expect after your surgery: Expect to have  vaginal bleeding/discharge for 2-3 days and spotting for up to 10 days. It is not unusual to have soreness for up to 1-2 weeks. You may have a slight burning sensation when you urinate for the first day. Mild cramps may continue for a couple of days. You may have a regular period in 2-6 weeks.  Call your doctor for any of the following:  Excessive vaginal bleeding or clotting, saturating and changing one pad every hour.  Inability to urinate 6 hours after discharge from hospital.  Pain not relieved by pain medication.  Fever of 100.4 F or greater.  Unusual vaginal discharge or odor.  Return to office _________________Call for an appointment ___________________ Patients signature: ______________________ Nurses signature ________________________  Post Anesthesia Care Unit [phone]

## 2015-11-15 NOTE — Transfer of Care (Signed)
Immediate Anesthesia Transfer of Care Note  Patient: Amber MohrRuth M Orozco-Martinez  Procedure(s) Performed: Procedure(s): DILATATION & CURETTAGE/HYSTEROSCOPY WITH RESECTION OF ENDOMETRIAL POLYPS (N/A)  Patient Location: PACU  Anesthesia Type:General  Level of Consciousness: awake, alert , oriented and patient cooperative  Airway & Oxygen Therapy: Patient Spontanous Breathing and Patient connected to face mask oxygen  Post-op Assessment: Report given to RN and Post -op Vital signs reviewed and stable  Post vital signs: Reviewed and stable  Last Vitals:  Filed Vitals:   11/15/15 0934  BP: 155/102  Pulse: 94  Temp: 37 C  Resp: 20    Complications: No apparent anesthesia complications

## 2015-11-15 NOTE — Anesthesia Postprocedure Evaluation (Signed)
Anesthesia Post Note  Patient: Amber MohrRuth M Orozco-Martinez  Procedure(s) Performed: Procedure(s) (LRB): DILATATION & CURETTAGE/HYSTEROSCOPY WITH RESECTION OF ENDOMETRIAL POLYPS (N/A)  Patient location during evaluation: PACU Anesthesia Type: General Level of consciousness: awake and alert Pain management: pain level controlled Vital Signs Assessment: post-procedure vital signs reviewed and stable Respiratory status: spontaneous breathing, nonlabored ventilation, respiratory function stable and patient connected to nasal cannula oxygen Cardiovascular status: blood pressure returned to baseline and stable Postop Assessment: no signs of nausea or vomiting Anesthetic complications: no Comments: Ms. Theresia LoOrozco was evaluated personally prior to discharge. She confirmed CPAP is optimized. She is alert and awake with no apparent residual effects of anesthesia. Her family member is a Charity fundraiserN and will be assisting with her home care this evening.      Last Vitals:  Filed Vitals:   11/15/15 1356 11/15/15 1445  BP:  141/91  Pulse: 86 86  Temp: 36.9 C   Resp: 24     Last Pain: There were no vitals filed for this visit.               Shelton SilvasKevin D Mccayla Shimada

## 2015-11-15 NOTE — Anesthesia Preprocedure Evaluation (Addendum)
Anesthesia Evaluation  Patient identified by MRN, date of birth, ID band Patient awake    Reviewed: Allergy & Precautions, H&P , NPO status , Patient's Chart, lab work & pertinent test results  History of Anesthesia Complications (+) PONV and history of anesthetic complications  Airway Mallampati: II  TM Distance: >3 FB Neck ROM: full    Dental no notable dental hx.    Pulmonary former smoker,    Pulmonary exam normal        Cardiovascular Exercise Tolerance: Good Normal cardiovascular exam Rhythm:regular Rate:Normal     Neuro/Psych negative neurological ROS  negative psych ROS   GI/Hepatic negative GI ROS, Neg liver ROS,   Endo/Other  Morbid obesity  Renal/GU negative Renal ROS  negative genitourinary   Musculoskeletal   Abdominal (+) + obese,   Peds  Hematology   Anesthesia Other Findings   Reproductive/Obstetrics negative OB ROS                            Anesthesia Physical Anesthesia Plan  ASA: III  Anesthesia Plan: General   Post-op Pain Management:    Induction: Intravenous  Airway Management Planned: LMA  Additional Equipment:   Intra-op Plan:   Post-operative Plan:   Informed Consent: I have reviewed the patients History and Physical, chart, labs and discussed the procedure including the risks, benefits and alternatives for the proposed anesthesia with the patient or authorized representative who has indicated his/her understanding and acceptance.     Plan Discussed with: CRNA and Surgeon  Anesthesia Plan Comments: (Pt refuses both spinal and epidural. Due to ventilatory concerns a discussion has taken place with Dr. Estanislado Pandyivard concerning the use of no Trendelenburg during the operation.)       Anesthesia Quick Evaluation

## 2015-11-15 NOTE — Interval H&P Note (Signed)
History and Physical Interval Note:  11/15/2015 11:02 AM  Amber Kline  has presented today for surgery, with the diagnosis of Dysfunctional Uterine Bleeding, Morbid Obesity  The various methods of treatment have been discussed with the patient and family. After consideration of risks, benefits and other options for treatment, the patient has consented to  Procedure(s): DILATATION & CURETTAGE/HYSTEROSCOPY WITH RESECTOCOPE (N/A) as a surgical intervention .  The patient's history has been reviewed, patient examined, no change in status, stable for surgery.  I have reviewed the patient's chart and labs.  Questions were answered to the patient's satisfaction.     [name] A

## 2015-11-15 NOTE — Anesthesia Procedure Notes (Signed)
Procedure Name: LMA Insertion Date/Time: 11/15/2015 11:18 AM Performed by: Suella GroveMOORE, Rileigh Kawashima C Pre-anesthesia Checklist: Timeout performed, Patient identified, Emergency Drugs available, Suction available and Patient being monitored Oxygen Delivery Method: Circle system utilized and Simple face mask Intubation Type: IV induction Ventilation: Mask ventilation without difficulty LMA: LMA inserted and LMA with gastric port inserted LMA Size: 4.0 Tube type: Oral Number of attempts: 1 Airway Equipment and Method: Patient positioned with wedge pillow Placement Confirmation: positive ETCO2 and breath sounds checked- equal and bilateral Tube secured with: Tape Dental Injury: Teeth and Oropharynx as per pre-operative assessment  Difficulty Due To: Difficulty was anticipated Future Recommendations: Recommend- induction with short-acting agent, and alternative techniques readily available

## 2015-11-15 NOTE — Op Note (Signed)
Preop diagnosis: DUB with Super morbid obesity ( BMI 88)  Postop diagnosis: same, endometrial polyps  Anesthesia: general with LMA  Anesthesiologist: Dr. Arby BarretteHatchett  Procedure: Hysteroscopy, resection of endometrial polyps and curettage  Surgeon: Dr. Dois DavenportSandra Raseel Jans  Procedure:   After being informed of the planned procedure with possible complications including bleeding, infection and uterine perforation, informed consent was obtained and patient was taken to or #4.  She was given general anesthesia without complication. She was placed in lithotomy with no trendelenberg, prepped and draped in the sterile fashion and  her bladder was emptied with a red rubber catheter. Pelvic exam is too limited to conclude on anatomy.  A weighted speculum is inserted in the vagina. The cervix was grasped with a tenaculum forcep placed on the anterior lip.We proceed with a paracervical block using 1% Nesacaine, 15 cc. Uterus is sounded at 9. The cervix is then easily dilated using Hegar dilator until # 31. This allows for easy placement of a diagnostic hysteroscope. With perfusion of LR at a maximum pressure of 90 mmHg, we are able to evaluate the entire uterine cavity.  Observation: overall thickened endometrium with multiple endometrial polyps. Both tubal ostia are visualized.  We change our diagnostic hysteroscope for a Versapoint hysteroscope and proceed with the resection of 2 polyps. We then removed our instrumentation. Using a sharp curette, we proceed with curettage of the endometrial cavity which returns a large  amount of polypoid endometrium.  Instruments are then removed. Instrument and sponge count is complete x2. Estimated blood loss is minimal. Water deficit is 210 cc of LR.  The procedure is very well tolerated by the patient who is taken to recovery room in a well and stable condition.  Specimen: Endometrial polyps and endometrial curettings sent to pathology.

## 2015-11-16 ENCOUNTER — Encounter (HOSPITAL_COMMUNITY): Payer: Self-pay | Admitting: Obstetrics and Gynecology

## 2015-12-07 NOTE — Addendum Note (Signed)
Addendum  created 12/07/15 2012 by Leilani AbleFranklin Nafisah Runions, MD   Modules edited: Anesthesia Responsible Staff

## 2016-12-28 ENCOUNTER — Encounter (HOSPITAL_COMMUNITY): Payer: Self-pay | Admitting: Emergency Medicine

## 2016-12-28 ENCOUNTER — Inpatient Hospital Stay (HOSPITAL_COMMUNITY)
Admission: EM | Admit: 2016-12-28 | Discharge: 2017-01-01 | DRG: 603 | Disposition: A | Discharge status: Home / Self Care | Payer: Managed Care, Other (non HMO) | Attending: Internal Medicine | Admitting: Internal Medicine

## 2016-12-28 DIAGNOSIS — Z881 Allergy status to other antibiotic agents status: Secondary | ICD-10-CM | POA: Diagnosis not present

## 2016-12-28 DIAGNOSIS — Z22322 Carrier or suspected carrier of Methicillin resistant Staphylococcus aureus: Secondary | ICD-10-CM

## 2016-12-28 DIAGNOSIS — Z6841 Body Mass Index (BMI) 40.0 and over, adult: Secondary | ICD-10-CM

## 2016-12-28 DIAGNOSIS — Z87891 Personal history of nicotine dependence: Secondary | ICD-10-CM

## 2016-12-28 DIAGNOSIS — Z8249 Family history of ischemic heart disease and other diseases of the circulatory system: Secondary | ICD-10-CM

## 2016-12-28 DIAGNOSIS — I890 Lymphedema, not elsewhere classified: Secondary | ICD-10-CM | POA: Diagnosis present

## 2016-12-28 DIAGNOSIS — L03115 Cellulitis of right lower limb: Principal | ICD-10-CM | POA: Diagnosis present

## 2016-12-28 DIAGNOSIS — Z8349 Family history of other endocrine, nutritional and metabolic diseases: Secondary | ICD-10-CM | POA: Diagnosis not present

## 2016-12-28 DIAGNOSIS — E662 Morbid (severe) obesity with alveolar hypoventilation: Secondary | ICD-10-CM | POA: Diagnosis present

## 2016-12-28 DIAGNOSIS — Z1630 Resistance to unspecified antimicrobial drugs: Secondary | ICD-10-CM | POA: Diagnosis not present

## 2016-12-28 DIAGNOSIS — Z833 Family history of diabetes mellitus: Secondary | ICD-10-CM

## 2016-12-28 DIAGNOSIS — G4733 Obstructive sleep apnea (adult) (pediatric): Secondary | ICD-10-CM | POA: Diagnosis not present

## 2016-12-28 DIAGNOSIS — R739 Hyperglycemia, unspecified: Secondary | ICD-10-CM | POA: Diagnosis not present

## 2016-12-28 DIAGNOSIS — B9689 Other specified bacterial agents as the cause of diseases classified elsewhere: Secondary | ICD-10-CM | POA: Diagnosis not present

## 2016-12-28 DIAGNOSIS — L089 Local infection of the skin and subcutaneous tissue, unspecified: Secondary | ICD-10-CM | POA: Diagnosis not present

## 2016-12-28 DIAGNOSIS — Z882 Allergy status to sulfonamides status: Secondary | ICD-10-CM | POA: Diagnosis not present

## 2016-12-28 LAB — CBC WITH DIFFERENTIAL/PLATELET
Basophils Absolute: 0 10*3/uL (ref 0.0–0.1)
Basophils Relative: 0 %
Eosinophils Absolute: 0 10*3/uL (ref 0.0–0.7)
Eosinophils Relative: 0 %
HCT: 41.4 % (ref 36.0–46.0)
Hemoglobin: 13.7 g/dL (ref 12.0–15.0)
Lymphocytes Relative: 6 %
Lymphs Abs: 0.8 10*3/uL (ref 0.7–4.0)
MCH: 29.7 pg (ref 26.0–34.0)
MCHC: 33.1 g/dL (ref 30.0–36.0)
MCV: 89.6 fL (ref 78.0–100.0)
Monocytes Absolute: 0.5 10*3/uL (ref 0.1–1.0)
Monocytes Relative: 4 %
Neutro Abs: 10.9 10*3/uL — ABNORMAL HIGH (ref 1.7–7.7)
Neutrophils Relative %: 90 %
Platelets: 239 10*3/uL (ref 150–400)
RBC: 4.62 MIL/uL (ref 3.87–5.11)
RDW: 14.5 % (ref 11.5–15.5)
WBC: 12.3 10*3/uL — ABNORMAL HIGH (ref 4.0–10.5)

## 2016-12-28 LAB — COMPREHENSIVE METABOLIC PANEL
ALT: 17 U/L (ref 14–54)
AST: 19 U/L (ref 15–41)
Albumin: 4 g/dL (ref 3.5–5.0)
Alkaline Phosphatase: 55 U/L (ref 38–126)
Anion gap: 10 (ref 5–15)
BUN: 11 mg/dL (ref 6–20)
CO2: 26 mmol/L (ref 22–32)
Calcium: 9.2 mg/dL (ref 8.9–10.3)
Chloride: 101 mmol/L (ref 101–111)
Creatinine, Ser: 0.59 mg/dL (ref 0.44–1.00)
GFR calc Af Amer: >60 mL/min (ref >60)
GFR calc non Af Amer: >60 mL/min (ref >60)
Glucose, Bld: 107 mg/dL — ABNORMAL HIGH (ref 65–99)
Potassium: 3.6 mmol/L (ref 3.5–5.1)
Sodium: 137 mmol/L (ref 135–145)
Total Bilirubin: 0.9 mg/dL (ref 0.3–1.2)
Total Protein: 8.4 g/dL — ABNORMAL HIGH (ref 6.5–8.1)

## 2016-12-28 LAB — GLUCOSE, CAPILLARY: Glucose-Capillary: 110 mg/dL — ABNORMAL HIGH (ref 65–99)

## 2016-12-28 LAB — I-STAT CG4 LACTIC ACID, ED: Lactic Acid, Venous: 1.52 mmol/L (ref 0.5–1.9)

## 2016-12-28 LAB — PROTIME-INR
INR: 1.09
Prothrombin Time: 14.2 seconds (ref 11.4–15.2)

## 2016-12-28 MED ORDER — ENOXAPARIN SODIUM 40 MG/0.4ML ~~LOC~~ SOLN: 40.0000 mg | SUBCUTANEOUS | Status: DC

## 2016-12-28 MED ORDER — KETOROLAC TROMETHAMINE 30 MG/ML IJ SOLN
INTRAMUSCULAR | Status: AC
  Filled 2016-12-28: qty 1

## 2016-12-28 MED ORDER — DIPHENHYDRAMINE HCL 50 MG/ML IJ SOLN: 25.0000 mg | Freq: Once | INTRAMUSCULAR | Status: DC

## 2016-12-28 MED ORDER — IBUPROFEN 200 MG PO TABS: 600.0000 mg | ORAL_TABLET | Freq: Four times a day (QID) | ORAL | Status: DC | PRN

## 2016-12-28 MED ORDER — DIPHENHYDRAMINE HCL 25 MG PO CAPS
25.0000 mg | ORAL_CAPSULE | Freq: Three times a day (TID) | ORAL | Status: DC
  Administered 2016-12-29 (×2): 25 mg via ORAL
  Filled 2016-12-28 (×3): qty 1

## 2016-12-28 MED ORDER — VANCOMYCIN HCL 10 G IV SOLR
2000.0000 mg | Freq: Once | INTRAVENOUS | Status: AC
  Administered 2016-12-28: 2000 mg via INTRAVENOUS
  Filled 2016-12-28: qty 2000

## 2016-12-28 MED ORDER — DEXTROSE 5 % IV SOLN
2.0000 g | INTRAVENOUS | Status: DC
  Administered 2016-12-28: 2 g via INTRAVENOUS
  Filled 2016-12-28 (×2): qty 2

## 2016-12-28 MED ORDER — DIPHENHYDRAMINE HCL 25 MG PO CAPS: 50.0000 mg | ORAL_CAPSULE | Freq: Four times a day (QID) | ORAL | Status: DC | PRN

## 2016-12-28 MED ORDER — DIPHENHYDRAMINE HCL 25 MG PO CAPS
25.0000 mg | ORAL_CAPSULE | Freq: Once | ORAL | Status: AC
  Administered 2016-12-28: 25 mg via ORAL
  Filled 2016-12-28: qty 1

## 2016-12-28 MED ORDER — DEXTROSE 5 % IV SOLN
1.0000 g | Freq: Once | INTRAVENOUS | Status: DC
Start: 2016-12-28 — End: 2016-12-28

## 2016-12-28 MED ORDER — VANCOMYCIN HCL 10 G IV SOLR
1500.0000 mg | Freq: Three times a day (TID) | INTRAVENOUS | Status: DC
  Administered 2016-12-29 (×2): 1500 mg via INTRAVENOUS
  Filled 2016-12-28 (×3): qty 1500

## 2016-12-28 MED ORDER — ENOXAPARIN SODIUM 100 MG/ML ~~LOC~~ SOLN
100.0000 mg | Freq: Every day | SUBCUTANEOUS | Status: DC
  Administered 2016-12-28 – 2016-12-31 (×4): 100 mg via SUBCUTANEOUS
  Filled 2016-12-28 (×5): qty 1

## 2016-12-28 MED ORDER — INSULIN ASPART 100 UNIT/ML ~~LOC~~ SOLN: 0.0000 [IU] | Freq: Three times a day (TID) | SUBCUTANEOUS | Status: DC

## 2016-12-28 MED ORDER — ONDANSETRON HCL 4 MG PO TABS: 4.0000 mg | ORAL_TABLET | Freq: Four times a day (QID) | ORAL | Status: DC | PRN

## 2016-12-28 MED ORDER — ONDANSETRON HCL 4 MG/2ML IJ SOLN: 4.0000 mg | Freq: Four times a day (QID) | INTRAMUSCULAR | Status: DC | PRN

## 2016-12-28 MED ORDER — VANCOMYCIN HCL 10 G IV SOLR: 2000.0000 mg | Freq: Two times a day (BID) | INTRAVENOUS | Status: DC

## 2016-12-28 MED ORDER — DEXTROSE 5 % IV SOLN: 2.0000 g | Freq: Once | INTRAVENOUS | Status: DC

## 2016-12-28 MED ORDER — KETOROLAC TROMETHAMINE 30 MG/ML IJ SOLN
30.0000 mg | Freq: Once | INTRAMUSCULAR | Status: AC
Start: 2016-12-28 — End: 2016-12-28
  Administered 2016-12-28: 30 mg via INTRAVENOUS

## 2016-12-28 NOTE — Progress Notes (Signed)
Pharmacy Antibiotic Note  Amber MohrRuth M Kline is a 41 y.o. female admitted on 12/28/2016 with recurrent lower extremity cellulitis that has been worsening on Keflex and Doxycycline since 12/16/16. She is [facility] Pharmacy has been consulted for Rocephin and Vancomycin dosing. Intolerance to Vanc is noted. SCr is wnl. Obese, ordered height and weight to be updated.   Plan: Rocephin 2g IV q24h. Give first. Benadryl 25mg  PO 30min prior to starting Vanc.  Vancomycin 2g IV once, infused over 3 hours rather than the standard 2 hours. Follow-up updated weight and order maintenance regimen.  Measure Vanc trough at steady state. Follow up renal fxn, culture results, and clinical course.  Height: 5\' 3"  (160 cm) Weight: (!) 479 lb (217.3 kg) IBW/kg (Calculated) : 52.4  Temp (24hrs), Avg:98.2 F (36.8 C), Min:98.2 F (36.8 C), Max:98.3 F (36.8 C)   Recent Labs Lab 12/28/16 1440 12/28/16 1451  WBC 12.3*  --   CREATININE 0.59  --   LATICACIDVEN  --  1.52    Estimated Creatinine Clearance: 178.2 mL/min (by C-G formula based on SCr of 0.59 mg/dL).    Allergies  Allergen Reactions  . Bactrim [Sulfamethoxazole-Trimethoprim] Hives  . Sulfa Antibiotics Hives  . Vancomycin Hives    Can take as long as benadryl is given prior to administration    Antimicrobials this admission: Rocephin 1/14 >>  Vanc 1/14 >>   Dose adjustments this admission:   Microbiology results: BCx: UCx:  Sputum:   MRSA PCR:   Thank you for allowing pharmacy to be a part of this patient's care.  Amber Kline, PharmD, pager [phone]. 12/28/2016,9:31 PM.  Addendum:  Weight 217kg. SCr is wnl, CrCl >100. Vanc maintenance: 1.5g IV q8h.  Amber Kline, PharmD, pager [phone]. 12/28/2016,9:35 PM.

## 2016-12-28 NOTE — ED Notes (Signed)
Dr. Bobette Moavid Manuel Ortiz, hospitalist, at bedside.

## 2016-12-28 NOTE — ED Notes (Signed)
PA at bedside.

## 2016-12-28 NOTE — ED Notes (Signed)
Blood culture x 2 (draw and hold) specimen sent down to lab.

## 2016-12-28 NOTE — Progress Notes (Signed)
Rx Brief note:  Lovenox  Wt=217 kg, CrCl>100 ml/min, BMI= 84  Rx adjusted Lovenox to 100 mg sq q24h (~0.5 mg/kg) for BMI>30 for dvt prophylaxis  Thanks Lorenza EvangelistGreen, Taylie Helder R 12/28/2016 9:21 PM

## 2016-12-28 NOTE — Progress Notes (Signed)
Pharmacy Antibiotic Note  Amber MohrRuth M Kline is a 41 y.o. female admitted on 12/28/2016 with recurrent lower extremity cellulitis that has been worsening on Keflex and Doxycycline since 12/16/16. She is [facility]Kim [facility]. Pharmacy has been consulted for Rocephin and Vancomycin dosing. Intolerance to Vanc is noted. SCr is wnl. Obese, ordered height and weight to be updated.   Plan: Rocephin 2g IV q24h. Give first. Benadryl 25mg  PO 30min prior to starting Vanc.  Vancomycin 2g IV once, infused over 3 hours rather than the standard 2 hours. Follow-up updated weight and order maintenance regimen.  Measure Vanc trough at steady state. Follow up renal fxn, culture results, and clinical course.     Temp (24hrs), Avg:98.3 F (36.8 C), Min:98.2 F (36.8 C), Max:98.3 F (36.8 C)   Recent Labs Lab 12/28/16 1440 12/28/16 1451  WBC 12.3*  --   CREATININE 0.59  --   LATICACIDVEN  --  1.52    CrCl cannot be calculated (Unknown ideal weight.).    Allergies  Allergen Reactions  . Bactrim [Sulfamethoxazole-Trimethoprim] Hives  . Sulfa Antibiotics Hives  . Vancomycin Hives    Can take as long as benadryl is given prior to administration    Antimicrobials this admission: Rocephin 1/14 >>  Vanc 1/14 >>   Dose adjustments this admission:   Microbiology results: BCx: UCx:  Sputum:   MRSA PCR:   Thank you for allowing pharmacy to be a part of this patient's care.  [name], PharmD, pager [phone]. 12/28/2016,7:36 PM.

## 2016-12-28 NOTE — ED Provider Notes (Signed)
[facility]-EMERGENCY DEPT Provider Note   CSN: [id number] Arrival date & time: 12/28/16  1325     History   Chief Complaint Chief Complaint  Patient presents with  . Cellulitis    HPI Amber Kline is a 41 y.o. female with pertinent past medical history of obesity, lymphedema, obstructive sleep apnea on BiPAP, recurrent cellulitis of right lower extremity presents with redness, warmth, tenderness over medial aspect of right lower extremity extending onto lower extremity. Patient states that she started having cellulitis in 2012 when she had to be admitted for IV antibiotics. Patient second hospitalization for IV antibiotics for cellulitis was in 2013. Patient was admitted to third time in 2016 first IV antibiotics for cellulitis. Patient states that she started having monthly recurrent cellulitis of right lower extremity and April 2017. Patient states she gets cellulitis on her right lower extremity monthly, usually takes Keflex and doxycycline which usually takes it away in 2-3 weeks. Patient was prescribed Keflex and doxycycline on 12/16/2016 for cellulitis however patient noticed yesterday that her redness had spread to lower aspect of her leg. Patient denies fevers, nausea, vomiting, body aches.  HPI  Past Medical History:  Diagnosis Date  . Cellulitis ~ 2005; 03/2011; 07/2011; 07/30/12   LLE: LLE: RLE/pt; RLE "both times I have been hospitalized it's been for the right leg"  . Dysrhythmia 07/30/2012   tachycardia  . On home O2 07/30/2012   "sleep w/2L @ night"  . OSA (obstructive sleep apnea)    "severe; sleep w/oxygen and BiPAP"  . PONV (postoperative nausea and vomiting)     Patient Active Problem List   Diagnosis Date Noted  . Cellulitis and abscess of leg 03/10/2015  . Sepsis (HCC) 03/04/2015  . Cellulitis of right lower extremity 03/04/2015  . Obstructive sleep apnea 07/31/2012  . Lymphedema 07/31/2012  . Protein calorie malnutrition (HCC) 07/31/2012  . Hypokalemia  07/31/2012  . Microcytic anemia 07/31/2012  . Hematuria 07/31/2012  . Morbid obesity (HCC) 07/31/2012  . Irregular menses 07/31/2012  . Hyperglycemia 07/30/2012    Past Surgical History:  Procedure Laterality Date  . DILATATION & CURRETTAGE/HYSTEROSCOPY WITH RESECTOCOPE N/A 11/15/2015   Procedure: DILATATION & CURETTAGE/HYSTEROSCOPY WITH RESECTION OF ENDOMETRIAL POLYPS;  Surgeon: Silverio Lay, MD;  Location: [facility];  Service: Gynecology;  Laterality: N/A;  . DILATION AND CURETTAGE OF UTERUS    . HYSTEROSCOPY W/D&C  2006   for uterine polyp    OB History    No data available       Home Medications    Prior to Admission medications   Medication Sig Start Date End Date Taking? Authorizing Provider  cephALEXin (KEFLEX) 500 MG capsule Take 500 mg by mouth 3 (three) times daily. Started 1/2 x 14 days 12/16/16  Yes Historical Provider, MD  doxycycline (VIBRA-TABS) 100 MG tablet Take 100 mg by mouth 2 (two) times daily. Started 1/2 x 14 days 12/16/16  Yes Historical Provider, MD  ibuprofen (ADVIL,MOTRIN) 600 MG tablet Take 1 tablet (600 mg total) by mouth every 6 (six) hours as needed. Patient taking differently: Take 600 mg by mouth every 6 (six) hours as needed for fever or headache.  11/15/15  Yes Silverio Lay, MD    Family History Family History  Problem Relation Age of Onset  . Hypercholesterolemia Mother   . Diabetes Mellitus II Father   . Hypercholesterolemia Father   . Hypertension Father     Social History Social History  Substance Use Topics  . Smoking status: Former Smoker  Years: 4.00    Types: Cigarettes    Quit date: 08/15/2009  . Smokeless tobacco: Never Used     Comment: 07/30/2012 "only smoked 3-4 cigarettes on the weekends when I was smoking"  . Alcohol use No     Comment: 07/30/2012 "have drank; never frequent; last drink ~ 2001"    Allergies   Bactrim [sulfamethoxazole-trimethoprim]; Sulfa antibiotics; and Vancomycin   Review of Systems Review of  Systems  Constitutional: Negative for appetite change, chills and fever.  HENT: Negative for congestion and sore throat.   Eyes: Negative for visual disturbance.  Respiratory: Negative for cough, choking, chest tightness and shortness of breath.   Cardiovascular: Negative for chest pain, palpitations and leg swelling.  Gastrointestinal: Negative for abdominal pain, constipation, diarrhea, nausea and vomiting.  Genitourinary: Negative for difficulty urinating and hematuria.  Musculoskeletal: Negative for arthralgias.  Skin: Positive for color change (R lower extremity associated with tenderness, warmth). Negative for rash and wound.  Neurological: Negative for dizziness, seizures, syncope, weakness, light-headedness, numbness and headaches.  Hematological: Does not bruise/bleed easily.  Psychiatric/Behavioral: Negative.      Physical Exam Updated Vital Signs BP 105/56   Pulse 105   Temp 98.2 F (36.8 C) (Oral)   Resp 18   Ht 5\' 3"  (1.6 m)   Wt (!) 217.3 kg   SpO2 100%   BMI 84.85 kg/m   Physical Exam  Constitutional: She is oriented to person, place, and time. She appears well-developed and well-nourished. No distress.  Morbidly obese female in NAD. Pleasant.   HENT:  Head: Normocephalic and atraumatic.  Nose: Nose normal.  Mouth/Throat: Oropharynx is clear and moist. No oropharyngeal exudate.  Eyes: Conjunctivae and EOM are normal. Pupils are equal, round, and reactive to light.  Neck: Normal range of motion. Neck supple. No JVD present.  Cardiovascular: Regular rhythm, normal heart sounds and intact distal pulses.   No murmur heard. Tachycardic in low 110s  Pulmonary/Chest: Effort normal and breath sounds normal. No respiratory distress. She has no wheezes. She has no rales. She exhibits no tenderness.  Abdominal: Soft. Bowel sounds are normal. There is no tenderness. There is no guarding.  Musculoskeletal: Normal range of motion. She exhibits no deformity.  Full ankle  and knee ROM without reported pain bilaterally. Pt able to ambulate.  Lymphadenopathy:    She has no cervical adenopathy.  Neurological: She is alert and oriented to person, place, and time. No sensory deficit.  Sensation to light touch intact in bilateral lower extremities.  5/5 strength with ankle dorsiflexion/plantar flexion.  Skin: Skin is warm and dry. Capillary refill takes less than 2 seconds.  Erythematous well demarcated tender rash cover entire right lower extremity and R medial upper extremity. No fluctuance or skin break down.  Please see picture.  Psychiatric: She has a normal mood and affect. Her behavior is normal. Judgment and thought content normal.  Nursing note and vitals reviewed.       ED Treatments / Results  Labs (all labs ordered are listed, but only abnormal results are displayed) Labs Reviewed  COMPREHENSIVE METABOLIC PANEL - Abnormal; Notable for the following:       Result Value   Glucose, Bld 107 (*)    Total Protein 8.4 (*)    All other components within normal limits  CBC WITH DIFFERENTIAL/PLATELET - Abnormal; Notable for the following:    WBC 12.3 (*)    Neutro Abs 10.9 (*)    All other components within normal limits  PROTIME-INR  I-STAT CG4 LACTIC ACID, ED    EKG  EKG Interpretation None       Radiology No results found.  Procedures Procedures (including critical care time)  Medications Ordered in ED Medications  vancomycin (VANCOCIN) 2,000 mg in sodium chloride 0.9 % 500 mL IVPB (2,000 mg Intravenous New Bag/Given 12/28/16 2033)  cefTRIAXone (ROCEPHIN) 2 g in dextrose 5 % 50 mL IVPB (0 g Intravenous Stopped 12/28/16 2016)  diphenhydrAMINE (BENADRYL) capsule 25 mg (25 mg Oral Given 12/28/16 1947)  ketorolac (TORADOL) 30 MG/ML injection 30 mg (30 mg Intravenous Given 12/28/16 2031)     Initial Impression / Assessment and Plan / ED Course  I have reviewed the triage vital signs and the nursing notes.  Pertinent labs & imaging  results that were available during my care of the patient were reviewed by me and considered in my medical decision making (see chart for details).  Clinical Course as of Dec 28 2106  Wynelle LinkSun Dec 28, 2016  2104 Leukocytosis WBC: (!) 12.3 [CG]  2104 Normal lactic acid  Lactic Acid, Venous: 1.52 [CG]  2104 Normal creatinine Creatinine: 0.59 [CG]  2104 Tachycardic in low 110s Pulse Rate: 110 [CG]  2104 Afebrile Temp: 98.3 F (36.8 C) [CG]    Clinical Course User Index [CG] Liberty Handylaudia J Tore Carreker, PA-C   41 yo female with pertinent pmh of morbid obesity, lymphedema, recurrent RLE cellulitis (monthly since April 2017) presents with cellulitis not responding to keflex and doxycycline prescribed on 12/17/15 by PCP.  Pt able to ambulate, afebrile but mildly tachycardic in low 110s. Mild leukocytosis at 12.3. Normal lactic acid, normal creatinine, normal LFTs.  Called pharmacy for assistance with antibiotics as patient has adverse reaction to vancomycin and has been on multiple doses of keflex/doxy.  Pharmacy recommended vancomycin with benadryl and rocephin, started. Spoke to hospitalist, Dr. Robb Matarrtiz, who will admit patient to med surg. Pt agreeable to admission.  Pt discussed with and evaluated by Dr. Juleen ChinaKohut who agrees with ED tx and admission.    Final Clinical Impressions(s) / ED Diagnoses   Final diagnoses:  None    New Prescriptions New Prescriptions   No medications on file     Jerrell MylarClaudia J Sidi Dzikowski, PA-C 12/28/16 2108    Raeford RazorStephen Kohut, MD 01/05/17 [phone]

## 2016-12-28 NOTE — ED Triage Notes (Signed)
Pt c/o cellulitis to right upper medial thigh, onset January 1st, last night noticed spread. Medial skin is indurated, red, hyperthermic. Pt already on doxycycline and Keflex for same, but infection has worsened.

## 2016-12-28 NOTE — H&P (Signed)
History and Physical    Amber Kline UEA:[id number] DOB: 12/09/1976 DOA: 12/28/2016  PCP: Delbert Harness, MD   Patient coming from: Home  Chief Complaint: Right leg infection.  HPI: Amber Kline is a 41 y.o. female with medical history significant of recurrent lower extremities cellulitis, lymphedema, obstructive sleep apnea on nocturnal BiPAP, dysrhythmia, morbid obesity is coming to the emergency department due to worsening of lower extremity cellulitis that did not respond to oral antibiotics.  Per patient, she has been having recurrent cellulitis of her lower extremities for several years. Her last episode that required inpatient admission was in December 2016 when she was put on IV antibiotics and discharged on Linezolid for 8 days. She states that since April 2017 she has been getting frequent lower extremity infections, which usually have responded to oral antibiotics. She normally takes Keflex and doxycycline and was prescribed another round on 12/16/2016 for these episodes, however she noticed that yesterday the erythema, edema and calor has extended to most of her medial right thigh associated with chills, but not sure about having fever. She also complains of increased fatigue, but denies headache, dyspnea, chest pain, abdominal pain, nausea, emesis, diarrhea, constipation or GU symptoms.  ED Course: The patient received vancomycin, ceftriaxone and analgesics. She is reporting pain relief. Her workup showed WBC of 12.3, hemoglobin of 13.7 g/dL, platelets of 478. Her lactic acid was 1.52, sodium 137, potassium 3.6, chloride 101, CO2 26 mmol/L. BUN was 11, creatinine 0.59 and glucose 107 mg/dL.  Review of Systems: As per HPI otherwise 10 point review of systems negative.    Past Medical History:  Diagnosis Date  . Cellulitis ~ 2005; 03/2011; 07/2011; 07/30/12   LLE: LLE: RLE/pt; RLE "both times I have been hospitalized it's been for the right leg"  . Dysrhythmia  07/30/2012   tachycardia  . On home O2 07/30/2012   "sleep w/2L @ night"  . OSA (obstructive sleep apnea)    "severe; sleep w/oxygen and BiPAP"  . PONV (postoperative nausea and vomiting)     Past Surgical History:  Procedure Laterality Date  . DILATATION & CURRETTAGE/HYSTEROSCOPY WITH RESECTOCOPE N/A 11/15/2015   Procedure: DILATATION & CURETTAGE/HYSTEROSCOPY WITH RESECTION OF ENDOMETRIAL POLYPS;  Surgeon: Silverio Lay, MD;  Location: [facility];  Service: Gynecology;  Laterality: N/A;  . DILATION AND CURETTAGE OF UTERUS    . HYSTEROSCOPY W/D&C  2006   for uterine polyp     reports that she quit smoking about 7 years ago. Her smoking use included Cigarettes. She quit after 4.00 years of use. She has never used smokeless tobacco. She reports that she does not drink alcohol or use drugs.  Allergies  Allergen Reactions  . Bactrim [Sulfamethoxazole-Trimethoprim] Hives  . Sulfa Antibiotics Hives  . Vancomycin Hives    Can take as long as benadryl is given prior to administration    Family History  Problem Relation Age of Onset  . Hypercholesterolemia Mother   . Diabetes Mellitus II Father   . Hypercholesterolemia Father   . Hypertension Father     Prior to Admission medications   Medication Sig Start Date End Date Taking? Authorizing Provider  cephALEXin (KEFLEX) 500 MG capsule Take 500 mg by mouth 3 (three) times daily. Started 1/2 x 14 days 12/16/16  Yes Historical Provider, MD  doxycycline (VIBRA-TABS) 100 MG tablet Take 100 mg by mouth 2 (two) times daily. Started 1/2 x 14 days 12/16/16  Yes Historical Provider, MD  ibuprofen (ADVIL,MOTRIN) 600 MG tablet Take 1  tablet (600 mg total) by mouth every 6 (six) hours as needed. Patient taking differently: Take 600 mg by mouth every 6 (six) hours as needed for fever or headache.  11/15/15  Yes Silverio LaySandra Rivard, MD    Physical Exam:  Constitutional: NAD, calm, comfortable Vitals:   12/28/16 2015 12/28/16 2019 12/28/16 2036 12/28/16 2125    BP:  (!) 126/49 105/56 (!) 114/58  Pulse: 110 108 105 (!) 103  Resp:   18 18  Temp:    98.2 F (36.8 C)  TempSrc:    Oral  SpO2: 100% 100% 100% 96%  Weight:    (!) 217.3 kg (479 lb)  Height:    5\' 3"  (1.6 m)   Eyes: PERRL, lids and conjunctivae normal ENMT: Mucous membranes are moist. Posterior pharynx clear of any exudate or lesions.Normal dentition.  Neck: normal, supple, no masses, no thyromegaly Respiratory: Decreased breath sounds on bases, otherwise clear to auscultation bilaterally, no wheezing, no crackles. Normal respiratory effort. No accessory muscle use.  Cardiovascular: Regular rate and rhythm, no murmurs / rubs / gallops. 1+ lower extremity edema. Positive lymphedema, faint pedal pulses. No carotid bruits.  Abdomen: Obese. Bowel sounds positive. Soft, no tenderness, no masses palpated. No hepatosplenomegaly.  Musculoskeletal: no clubbing / cyanosis. Good ROM, no contractures. Normal muscle tone.  Skin: Positive erythema, calor and tenderness on right medial thigh area. See picture. Neurologic: CN 2-12 grossly intact. Sensation intact, DTR normal. Strength 5/5 in all 4.  Psychiatric: Normal judgment and insight. Alert and oriented x 3. Normal mood.    Labs on Admission: I have personally reviewed following labs and imaging studies  CBC:  Recent Labs Lab 12/28/16 1440  WBC 12.3*  NEUTROABS 10.9*  HGB 13.7  HCT 41.4  MCV 89.6  PLT 239   Basic Metabolic Panel:  Recent Labs Lab 12/28/16 1440  NA 137  K 3.6  CL 101  CO2 26  GLUCOSE 107*  BUN 11  CREATININE 0.59  CALCIUM 9.2   GFR: Estimated Creatinine Clearance: 178.2 mL/min (by C-G formula based on SCr of 0.59 mg/dL). Liver Function Tests:  Recent Labs Lab 12/28/16 1440  AST 19  ALT 17  ALKPHOS 55  BILITOT 0.9  PROT 8.4*  ALBUMIN 4.0   No results for input(s): LIPASE, AMYLASE in the last 168 hours. No results for input(s): AMMONIA in the last 168 hours. Coagulation Profile:  Recent  Labs Lab 12/28/16 1440  INR 1.09   Cardiac Enzymes: No results for input(s): CKTOTAL, CKMB, CKMBINDEX, TROPONINI in the last 168 hours. BNP (last 3 results) No results for input(s): PROBNP in the last 8760 hours. HbA1C: No results for input(s): HGBA1C in the last 72 hours. CBG: No results for input(s): GLUCAP in the last 168 hours. Lipid Profile: No results for input(s): CHOL, HDL, LDLCALC, TRIG, CHOLHDL, LDLDIRECT in the last 72 hours. Thyroid Function Tests: No results for input(s): TSH, T4TOTAL, FREET4, T3FREE, THYROIDAB in the last 72 hours. Anemia Panel: No results for input(s): VITAMINB12, FOLATE, FERRITIN, TIBC, IRON, RETICCTPCT in the last 72 hours. Urine analysis:    Component Value Date/Time   COLORURINE YELLOW 07/30/2012 1734   APPEARANCEUR CLEAR 07/30/2012 1734   LABSPEC 1.012 07/30/2012 1734   PHURINE 6.5 07/30/2012 1734   GLUCOSEU NEGATIVE 07/30/2012 1734   HGBUR MODERATE (A) 07/30/2012 1734   BILIRUBINUR NEGATIVE 07/30/2012 1734   KETONESUR NEGATIVE 07/30/2012 1734   PROTEINUR NEGATIVE 07/30/2012 1734   UROBILINOGEN 1.0 07/30/2012 1734   NITRITE NEGATIVE 07/30/2012 1734  LEUKOCYTESUR NEGATIVE 07/30/2012 1734    Radiological Exams on Admission: No results found.  EKG: Not applied.   Assessment/Plan Principal Problem:   Cellulitis of right lower extremity Admit to MedSurg/inpatient. Continue analgesics as needed. Continue IV vancomycin with premedication with Benadryl as previously done. Continue ceftriaxone per pharmacy. MRSA screen by PCR.  Active Problems:   Obstructive sleep apnea Continue supplemental oxygen and BiPAP ventilation at bedtime.    Hyperglycemia Carbohydrate modified diet. CBG monitoring with regular insulin sliding scale. Check hemoglobin A1c in a.m.     DVT prophylaxis: Lovenox. Code Status: Full code. Family Communication:  Disposition Plan: Admitted for IV antibiotic therapy for 2-3 days. Consults called:   Admission status: Inpatient/MedSurg.   Bobette Mo MD [facility] Pager [phone]  If 7PM-7AM, please contact night-coverage [email or web address] Password [facility]  12/28/2016, 9:27 PM

## 2016-12-29 DIAGNOSIS — Z8249 Family history of ischemic heart disease and other diseases of the circulatory system: Secondary | ICD-10-CM

## 2016-12-29 DIAGNOSIS — Z881 Allergy status to other antibiotic agents status: Secondary | ICD-10-CM

## 2016-12-29 DIAGNOSIS — Z87891 Personal history of nicotine dependence: Secondary | ICD-10-CM

## 2016-12-29 DIAGNOSIS — I890 Lymphedema, not elsewhere classified: Secondary | ICD-10-CM

## 2016-12-29 DIAGNOSIS — Z8349 Family history of other endocrine, nutritional and metabolic diseases: Secondary | ICD-10-CM

## 2016-12-29 DIAGNOSIS — Z1630 Resistance to unspecified antimicrobial drugs: Secondary | ICD-10-CM

## 2016-12-29 DIAGNOSIS — B9689 Other specified bacterial agents as the cause of diseases classified elsewhere: Secondary | ICD-10-CM

## 2016-12-29 DIAGNOSIS — Z882 Allergy status to sulfonamides status: Secondary | ICD-10-CM

## 2016-12-29 DIAGNOSIS — Z833 Family history of diabetes mellitus: Secondary | ICD-10-CM

## 2016-12-29 LAB — COMPREHENSIVE METABOLIC PANEL
ALBUMIN: 3.1 g/dL — AB (ref 3.5–5.0)
ALT: 13 U/L — AB (ref 14–54)
AST: 15 U/L (ref 15–41)
Alkaline Phosphatase: 43 U/L (ref 38–126)
Anion gap: 8 (ref 5–15)
BUN: 11 mg/dL (ref 6–20)
CHLORIDE: 103 mmol/L (ref 101–111)
CO2: 27 mmol/L (ref 22–32)
Calcium: 8.2 mg/dL — ABNORMAL LOW (ref 8.9–10.3)
Creatinine, Ser: 0.54 mg/dL (ref 0.44–1.00)
GFR calc Af Amer: >60 mL/min (ref >60)
GFR calc non Af Amer: >60 mL/min (ref >60)
GLUCOSE: 126 mg/dL — AB (ref 65–99)
POTASSIUM: 3.5 mmol/L (ref 3.5–5.1)
SODIUM: 138 mmol/L (ref 135–145)
Total Bilirubin: 0.7 mg/dL (ref 0.3–1.2)
Total Protein: 6.7 g/dL (ref 6.5–8.1)

## 2016-12-29 LAB — CBC WITH DIFFERENTIAL/PLATELET
BASOS ABS: 0 10*3/uL (ref 0.0–0.1)
BASOS PCT: 0 %
Eosinophils Absolute: 0.1 10*3/uL (ref 0.0–0.7)
Eosinophils Relative: 1 %
HCT: 33.8 % — ABNORMAL LOW (ref 36.0–46.0)
Hemoglobin: 10.9 g/dL — ABNORMAL LOW (ref 12.0–15.0)
LYMPHS PCT: 13 %
Lymphs Abs: 0.8 10*3/uL (ref 0.7–4.0)
MCH: 28.9 pg (ref 26.0–34.0)
MCHC: 32.2 g/dL (ref 30.0–36.0)
MCV: 89.7 fL (ref 78.0–100.0)
MONO ABS: 0.8 10*3/uL (ref 0.1–1.0)
Monocytes Relative: 12 %
NEUTROS ABS: 4.6 10*3/uL (ref 1.7–7.7)
Neutrophils Relative %: 73 %
PLATELETS: 185 10*3/uL (ref 150–400)
RBC: 3.77 MIL/uL — AB (ref 3.87–5.11)
RDW: 14.8 % (ref 11.5–15.5)
WBC: 6.3 10*3/uL (ref 4.0–10.5)

## 2016-12-29 LAB — GLUCOSE, CAPILLARY
GLUCOSE-CAPILLARY: 99 mg/dL (ref 65–99)
GLUCOSE-CAPILLARY: 100 mg/dL — AB (ref 65–99)
Glucose-Capillary: 107 mg/dL — ABNORMAL HIGH (ref 65–99)
Glucose-Capillary: 86 mg/dL (ref 65–99)

## 2016-12-29 LAB — MRSA PCR SCREENING: MRSA BY PCR: POSITIVE — AB

## 2016-12-29 MED ORDER — CEFAZOLIN SODIUM-DEXTROSE 2-4 GM/100ML-% IV SOLN
2.0000 g | Freq: Three times a day (TID) | INTRAVENOUS | Status: DC
  Administered 2016-12-29 – 2017-01-01 (×9): 2 g via INTRAVENOUS
  Filled 2016-12-29 (×10): qty 100

## 2016-12-29 MED ORDER — DIPHENHYDRAMINE HCL 25 MG PO CAPS: 25.0000 mg | ORAL_CAPSULE | Freq: Three times a day (TID) | ORAL | Status: DC

## 2016-12-29 MED ORDER — MUPIROCIN 2 % EX OINT
1.0000 "application " | TOPICAL_OINTMENT | Freq: Two times a day (BID) | CUTANEOUS | Status: DC
  Administered 2016-12-29 – 2017-01-01 (×8): 1 via NASAL
  Filled 2016-12-29: qty 22

## 2016-12-29 MED ORDER — CHLORHEXIDINE GLUCONATE CLOTH 2 % EX PADS
6.0000 | MEDICATED_PAD | Freq: Every day | CUTANEOUS | Status: DC
  Administered 2016-12-29 – 2017-01-01 (×3): 6 via TOPICAL

## 2016-12-29 NOTE — Progress Notes (Signed)
CRITICAL VALUE ALERT  Critical value received:  + MRSA pcr  Date of notification:  12/29/16  Time of notification:  0130  Critical value read back:Yes.    Nurse who received alert:  Dani GobbleAmanda Esmond Hinch  MD notified (1st page):  A Hugelmeyer  Time of first page:  0130  MD notified (2nd page):  Time of second page:  Responding MD:  Hugelmeyer  Time MD responded:  [id number]

## 2016-12-29 NOTE — Consult Note (Signed)
[name]  Total days of antibiotics        Day 2 vanco        Day 1 ceftriaxone              Reason for Consult: recurrent cellulitis    Referring Physician: short  Principal Problem:   Cellulitis of right lower extremity Active Problems:   Obstructive sleep apnea   Hyperglycemia    HPI: Amber Kline is a 41 y.o. female with morbid obesity, chronic lymphedema, OSA, with recurrent right leg cellulitis. She states that she had been taking abtx since the beginning of the new year, cephalexin plus doxy. She started to notice worsening erythema of her right leg and right groin tenderness at her LN, when she missed one-two dose of her medication. She came to the ED for evaluation since in the past, she would quickly become ill if her cellulitis was not treated. 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 She has never been to a lymphedema clinic for evaluation. In the past, the past has taken linezolid, doxy, keflex for her cellulitis. No recent scratches or abrasions. Since start abtx on 1/14, she has had some improvement in her right leg.  ID consulted to see if she would need IV abtx since she failed oral abtx  Past Medical History:  Diagnosis Date  . Cellulitis ~ 2005; 03/2011; 07/2011; 07/30/12   LLE: LLE: RLE/pt; RLE "both times I have been hospitalized it's been for the right leg"  . Dysrhythmia 07/30/2012   tachycardia  . On home O2 07/30/2012   "sleep w/2L @ night"  . OSA (obstructive sleep apnea)    "severe; sleep w/oxygen and BiPAP"  . PONV (postoperative nausea and vomiting)     Allergies:  Allergies  Allergen Reactions  . Bactrim [Sulfamethoxazole-Trimethoprim] Hives  . Sulfa Antibiotics Hives  . Vancomycin Hives    Can take as long as benadryl is given prior to administration    MEDICATIONS: .  ceFAZolin (ANCEF) IV  2 g Intravenous Q8H  .  Chlorhexidine Gluconate Cloth  6 each Topical Q0600  . diphenhydrAMINE  25 mg Oral Q8H  . enoxaparin (LOVENOX) injection  100 mg Subcutaneous QHS  . insulin aspart  0-20 Units Subcutaneous TID WC  . mupirocin ointment  1 application Nasal BID    Social History  Substance Use Topics  . Smoking status: Former Smoker    Years: 4.00    Types: Cigarettes    Quit date: 08/15/2009  . Smokeless tobacco: Never Used     Comment: 07/30/2012 "only smoked 3-4 cigarettes on the weekends when I was smoking"  . Alcohol use No     Comment: 07/30/2012 "have drank; never frequent; last drink ~ 2001"    Family History  Problem Relation Age of Onset  . Hypercholesterolemia Mother   . Diabetes Mellitus II Father   . Hypercholesterolemia Father   . Hypertension Father     Review of Systems  Constitutional: Negative for fever, chills, diaphoresis, activity change, appetite change, fatigue and unexpected weight change.  HENT: Negative for congestion, sore throat, rhinorrhea, sneezing, trouble swallowing and sinus pressure.  Eyes: Negative for photophobia and visual disturbance.  Respiratory: Negative for cough, chest tightness, shortness of breath, wheezing and stridor.  Cardiovascular: Negative for chest pain, palpitations and leg swelling.  Gastrointestinal: Negative for nausea, vomiting, abdominal pain, diarrhea, constipation, blood in stool,  abdominal distention and anal bleeding.  Genitourinary: Negative for dysuria, hematuria, flank pain and difficulty urinating.  Musculoskeletal: Negative for myalgias, back pain, joint swelling, arthralgias and gait problem.  Skin: + right leg redness and pain Neurological: Negative for dizziness, tremors, weakness and light-headedness.  Hematological: Negative for adenopathy. Does not bruise/bleed easily.  Psychiatric/Behavioral: Negative for behavioral problems, confusion, sleep disturbance, dysphoric mood, decreased concentration and agitation.      OBJECTIVE: Temp:  [98.2 F (36.8 C)-99.2 F (37.3 C)] 98.3 F (36.8 C) (01/15 1508) Pulse Rate:  [87-110] 107 (01/15 1508) Resp:  [16-18] 18 (01/15 1508) BP: (105-158)/(49-84) 127/70 (01/15 1508) SpO2:  [96 %-100 %] 98 % (01/15 1508) Weight:  [479 lb (217.3 kg)] 479 lb (217.3 kg) (01/14 2125)  Physical Exam  Constitutional:  oriented to person, place, and time. appears well-developed and well-nourished. No distress.  HENT: [place]/AT, PERRLA, no scleral icterus Mouth/Throat: Oropharynx is clear and moist. No oropharyngeal exudate.  Cardiovascular: Normal rate, regular rhythm and normal heart sounds. Exam reveals no gallop and no friction rub.  No murmur heard.  Pulmonary/Chest: Effort normal and breath sounds normal. No respiratory distress.  has no wheezes.  Neck = supple, no nuchal rigidity Abdominal: Soft. Bowel sounds are normal.  exhibits no distension. There is no tenderness.  Lymphadenopathy: no cervical adenopathy. No axillary adenopathy Neurological: alert and oriented to person, place, and time Ext: chronic lymphedema noted to BLE. Non pitting edema Skin: Skin is warm and dry. Blanching erythema from lower calf to upper thigh, inner thigh is more red than exterior thigh. No draining wounds.  Psychiatric: a normal mood and affect.  behavior is normal.   LABS: Results for orders placed or performed during the hospital encounter of 12/28/16 (from the past 48 hour(s))  Comprehensive metabolic panel     Status: Abnormal   Collection Time: 12/28/16  2:40 PM  Result Value Ref Range   Sodium 137 135 - 145 mmol/L   Potassium 3.6 3.5 - 5.1 mmol/L   Chloride 101 101 - 111 mmol/L   CO2 26 22 - 32 mmol/L   Glucose, Bld 107 (H) 65 - 99 mg/dL   BUN 11 6 - 20 mg/dL   Creatinine, Ser 0.59 0.44 - 1.00 mg/dL   Calcium 9.2 8.9 - 10.3 mg/dL   Total Protein 8.4 (H) 6.5 - 8.1 g/dL   Albumin 4.0 3.5 - 5.0 g/dL   AST 19 15 - 41 U/L   ALT 17 14 - 54 U/L   Alkaline Phosphatase 55 38 - 126  U/L   Total Bilirubin 0.9 0.3 - 1.2 mg/dL   GFR calc non Af Amer >60 >60 mL/min   GFR calc Af Amer >60 >60 mL/min    Comment: (NOTE) The eGFR has been calculated using the CKD EPI equation. This calculation has not been validated in all clinical situations. eGFR's persistently <60 mL/min signify possible Chronic Kidney Disease.    Anion gap 10 5 - 15  CBC with Differential     Status: Abnormal   Collection Time: 12/28/16  2:40 PM  Result Value Ref Range   WBC 12.3 (H) 4.0 - 10.5 K/uL   RBC 4.62 3.87 - 5.11 MIL/uL   Hemoglobin 13.7 12.0 - 15.0 g/dL   HCT 41.4 36.0 - 46.0 %   MCV 89.6 78.0 - 100.0 fL   MCH 29.7 26.0 - 34.0 pg   MCHC 33.1 30.0 - 36.0 g/dL   RDW 14.5 11.5 - 15.5 %   Platelets 239  150 - 400 K/uL   Neutrophils Relative % 90 %   Neutro Abs 10.9 (H) 1.7 - 7.7 K/uL   Lymphocytes Relative 6 %   Lymphs Abs 0.8 0.7 - 4.0 K/uL   Monocytes Relative 4 %   Monocytes Absolute 0.5 0.1 - 1.0 K/uL   Eosinophils Relative 0 %   Eosinophils Absolute 0.0 0.0 - 0.7 K/uL   Basophils Relative 0 %   Basophils Absolute 0.0 0.0 - 0.1 K/uL  Protime-INR     Status: None   Collection Time: 12/28/16  2:40 PM  Result Value Ref Range   Prothrombin Time 14.2 11.4 - 15.2 seconds   INR 1.09   I-Stat CG4 Lactic Acid, ED     Status: None   Collection Time: 12/28/16  2:51 PM  Result Value Ref Range   Lactic Acid, Venous 1.52 0.5 - 1.9 mmol/L  MRSA PCR Screening     Status: Abnormal   Collection Time: 12/28/16  9:29 PM  Result Value Ref Range   MRSA by PCR POSITIVE (A) NEGATIVE    Comment:        The GeneXpert MRSA Assay (FDA approved for NASAL specimens only), is one component of a comprehensive MRSA colonization surveillance program. It is not intended to diagnose MRSA infection nor to guide or monitor treatment for MRSA infections. RESULT CALLED TO, READ BACK BY AND VERIFIED WITH: Shela Leff [id number] @ 0129 BY J SCOTTON   Glucose, capillary     Status: Abnormal   Collection  Time: 12/28/16 10:23 PM  Result Value Ref Range   Glucose-Capillary 110 (H) 65 - 99 mg/dL  CBC WITH DIFFERENTIAL     Status: Abnormal   Collection Time: 12/29/16  5:08 AM  Result Value Ref Range   WBC 6.3 4.0 - 10.5 K/uL   RBC 3.77 (L) 3.87 - 5.11 MIL/uL   Hemoglobin 10.9 (L) 12.0 - 15.0 g/dL    Comment: DELTA CHECK NOTED REPEATED TO VERIFY    HCT 33.8 (L) 36.0 - 46.0 %   MCV 89.7 78.0 - 100.0 fL   MCH 28.9 26.0 - 34.0 pg   MCHC 32.2 30.0 - 36.0 g/dL   RDW 14.8 11.5 - 15.5 %   Platelets 185 150 - 400 K/uL   Neutrophils Relative % 73 %   Neutro Abs 4.6 1.7 - 7.7 K/uL   Lymphocytes Relative 13 %   Lymphs Abs 0.8 0.7 - 4.0 K/uL   Monocytes Relative 12 %   Monocytes Absolute 0.8 0.1 - 1.0 K/uL   Eosinophils Relative 1 %   Eosinophils Absolute 0.1 0.0 - 0.7 K/uL   Basophils Relative 0 %   Basophils Absolute 0.0 0.0 - 0.1 K/uL  Comprehensive metabolic panel     Status: Abnormal   Collection Time: 12/29/16  5:08 AM  Result Value Ref Range   Sodium 138 135 - 145 mmol/L   Potassium 3.5 3.5 - 5.1 mmol/L   Chloride 103 101 - 111 mmol/L   CO2 27 22 - 32 mmol/L   Glucose, Bld 126 (H) 65 - 99 mg/dL   BUN 11 6 - 20 mg/dL   Creatinine, Ser 0.54 0.44 - 1.00 mg/dL   Calcium 8.2 (L) 8.9 - 10.3 mg/dL   Total Protein 6.7 6.5 - 8.1 g/dL   Albumin 3.1 (L) 3.5 - 5.0 g/dL   AST 15 15 - 41 U/L   ALT 13 (L) 14 - 54 U/L   Alkaline Phosphatase 43 38 - 126 U/L  Total Bilirubin 0.7 0.3 - 1.2 mg/dL   GFR calc non Af Amer >60 >60 mL/min   GFR calc Af Amer >60 >60 mL/min    Comment: (NOTE) The eGFR has been calculated using the CKD EPI equation. This calculation has not been validated in all clinical situations. eGFR's persistently <60 mL/min signify possible Chronic Kidney Disease.    Anion gap 8 5 - 15  Glucose, capillary     Status: Abnormal   Collection Time: 12/29/16  8:30 AM  Result Value Ref Range   Glucose-Capillary 107 (H) 65 - 99 mg/dL  Glucose, capillary     Status: None    Collection Time: 12/29/16 12:13 PM  Result Value Ref Range   Glucose-Capillary 86 65 - 99 mg/dL    MICRO: MRSA screen +  Assessment/Plan:  41yo F with recurrent right lower leg cellulitis due to chronic lymphedema, failed oral abtx  - change IV abtx to cefazolin instead of ceftriaxone to cover strep/MSSA - if continued improvement, we can likely arrange for oritanvancin infusion through infusion center to provide long term IV coverage for SSTI. - recommend to refer to [place] lymphedema clinic for evaluation and management

## 2016-12-29 NOTE — Progress Notes (Signed)
Pharmacy Antibiotic Note  Amber MohrRuth M Orozco-Martinez is a 41 y.o. female admitted on 12/28/2016 with recurrent lower extremity cellulitis that has been worsening on Keflex and Doxycycline since 12/16/16. She is [facility] Pharmacy initially consulted for Rocephin and Vancomycin dosing, now asked to narrow to cefazolin.   Plan:  Cefazolin 2g IV q8h for weight and renal function  No further dosing adjustments anticipated  Height: 5\' 3"  (160 cm) Weight: (!) 479 lb (217.3 kg) IBW/kg (Calculated) : 52.4  Temp (24hrs), Avg:98.5 F (36.9 C), Min:98.2 F (36.8 C), Max:99.2 F (37.3 C)   Recent Labs Lab 12/28/16 1440 12/28/16 1451 12/29/16 0508  WBC 12.3*  --  6.3  CREATININE 0.59  --  0.54  LATICACIDVEN  --  1.52  --     Estimated Creatinine Clearance: 178.2 mL/min (by C-G formula based on SCr of 0.54 mg/dL).    Allergies  Allergen Reactions  . Bactrim [Sulfamethoxazole-Trimethoprim] Hives  . Sulfa Antibiotics Hives  . Vancomycin Hives    Can take as long as benadryl is given prior to administration    Antimicrobials this admission: Rocephin 1/14 >> 1/15 Vanc 1/14 >> 1/15 Cefazolin 1/15 >>  Dose adjustments this admission: ---  Microbiology results: 1/14 MRSA PCR: POSITIVE  Thank you for allowing pharmacy to be a part of this patient's care.  Loralee PacasErin Kane Kusek, PharmD, BCPS Pager: [phone] 12/29/2016,1:27 PM.

## 2016-12-29 NOTE — Progress Notes (Signed)
PROGRESS NOTE  Amber Kline  WUJ:[id number] DOB: 1976-03-03 DOA: 12/28/2016 PCP: Delbert Harness, MD  Brief Narrative:   The patient is a 41 year old female with history of morbid obesity, obstructive sleep apnea on nocturnal BiPAP, probable obesity hypoventilation syndrome adjusted by elevated bicarbonate, lymphedema, recurrent lower extremity cellulitis who presents with right lower extremity cellulitis. She has recently been on about monthly courses of oral antibiotics for right lower extremity cellulitis. She has been taking Keflex and doxycycline since 12/16/2016 but did not have improvement and she presented to the emergency department after the erythema spread from her medial right thigh down to her calf. She had chills.  She has been started on vancomycin and ceftriaxone. I discussed her care with infectious disease today and have changed her antibiotics to Ancef at their recommendation.  She is colonized with MRSA.  We discussed the possibility of oritavancin infusions as an outpatient.    Assessment & Plan:   Principal Problem:   Cellulitis of right lower extremity Active Problems:   Obstructive sleep apnea   Hyperglycemia   Cellulitis of right lower extremity -  Discontinue ceftriaxone and vancomycin -  Start Ancef -  Infectious disease consultation  Active Problems:   Obstructive sleep apnea Continue supplemental oxygen and BiPAP ventilation at bedtime.    Hyperglycemia -  A1c pending -  SSI  DVT prophylaxis:  Lovenox Code Status:  Full code Family Communication:  Patient and her husband Disposition Plan:  Pending improvement in cellulitis   Consultants:   Infectious disease  Procedures:  None  Antimicrobials:  Anti-infectives    Start     Dose/Rate Route Frequency Ordered Stop   12/29/16 1800  ceFAZolin (ANCEF) IVPB 2g/100 mL premix     2 g 200 mL/hr over 30 Minutes Intravenous Every 8 hours 12/29/16 1327     12/29/16 0400  vancomycin (VANCOCIN)  1,500 mg in sodium chloride 0.9 % 500 mL IVPB  Status:  Discontinued     1,500 mg 166.7 mL/hr over 180 Minutes Intravenous Every 8 hours 12/28/16 2138 12/29/16 1318   12/28/16 2100  vancomycin (VANCOCIN) 2,000 mg in sodium chloride 0.9 % 500 mL IVPB     2,000 mg 166.7 mL/hr over 180 Minutes Intravenous  Once 12/28/16 1929 12/28/16 2333   12/28/16 2000  cefTRIAXone (ROCEPHIN) 2 g in dextrose 5 % 50 mL IVPB  Status:  Discontinued     2 g 100 mL/hr over 30 Minutes Intravenous  Once 12/28/16 1929 12/28/16 1930   12/28/16 2000  cefTRIAXone (ROCEPHIN) 2 g in dextrose 5 % 50 mL IVPB  Status:  Discontinued     2 g 100 mL/hr over 30 Minutes Intravenous Every 24 hours 12/28/16 1930 12/29/16 1318   12/28/16 1930  cefTRIAXone (ROCEPHIN) 1 g in dextrose 5 % 50 mL IVPB  Status:  Discontinued     1 g 100 mL/hr over 30 Minutes Intravenous  Once 12/28/16 1922 12/28/16 1929   12/28/16 1930  vancomycin (VANCOCIN) 2,000 mg in sodium chloride 0.9 % 500 mL IVPB  Status:  Discontinued     2,000 mg 250 mL/hr over 120 Minutes Intravenous Every 12 hours 12/28/16 1922 12/28/16 1929       Subjective: Pinkness appears less intense today. Area of erythema has not spread since yesterday. She denies chills and fevers overnight. Denies nausea.  Objective: Vitals:   12/28/16 2036 12/28/16 2125 12/29/16 0705 12/29/16 1508  BP: 105/56 (!) 114/58 (!) 120/58 127/70  Pulse: 105 (!) 103 87 Marland Kitchen)  107  Resp: 18 18 18 18   Temp:  98.2 F (36.8 C) 99.2 F (37.3 C) 98.3 F (36.8 C)  TempSrc:  Oral Oral Oral  SpO2: 100% 96% 100% 98%  Weight:  (!) 217.3 kg (479 lb)    Height:  5\' 3"  (1.6 m)      Intake/Output Summary (Last 24 hours) at 12/29/16 1838 Last data filed at 12/29/16 1837  Gross per 24 hour  Intake             1820 ml  Output                0 ml  Net             1820 ml   Filed Weights   12/28/16 1929 12/28/16 2125  Weight: (!) 217.3 kg (479 lb) (!) 217.3 kg (479 lb)    Examination:  General exam:   Adult Female.  No acute distress.  HEENT:  NCAT, MMM Respiratory system: Clear to auscultation bilaterally Cardiovascular system: Regular rate and rhythm, normal S1/S2. No murmurs, rubs, gallops or clicks.  Warm extremities Gastrointestinal system: Normal active bowel sounds, soft, nondistended, nontender. MSK:  Normal tone and bulk, 1+ pitting bilateral lower extremity edema Skin: Right lower extremity has erythema in the same distribution as was photographed by the admitting physician on 1/14 however the intensity of the pinkness appears to be marginally improved.  No purulence, vesicles, areas of obvious necrosis. Neuro:  Grossly intact    Data Reviewed: I have personally reviewed following labs and imaging studies  CBC:  Recent Labs Lab 12/28/16 1440 12/29/16 0508  WBC 12.3* 6.3  NEUTROABS 10.9* 4.6  HGB 13.7 10.9*  HCT 41.4 33.8*  MCV 89.6 89.7  PLT 239 185   Basic Metabolic Panel:  Recent Labs Lab 12/28/16 1440 12/29/16 0508  NA 137 138  K 3.6 3.5  CL 101 103  CO2 26 27  GLUCOSE 107* 126*  BUN 11 11  CREATININE 0.59 0.54  CALCIUM 9.2 8.2*   GFR: Estimated Creatinine Clearance: 178.2 mL/min (by C-G formula based on SCr of 0.54 mg/dL). Liver Function Tests:  Recent Labs Lab 12/28/16 1440 12/29/16 0508  AST 19 15  ALT 17 13*  ALKPHOS 55 43  BILITOT 0.9 0.7  PROT 8.4* 6.7  ALBUMIN 4.0 3.1*   No results for input(s): LIPASE, AMYLASE in the last 168 hours. No results for input(s): AMMONIA in the last 168 hours. Coagulation Profile:  Recent Labs Lab 12/28/16 1440  INR 1.09   Cardiac Enzymes: No results for input(s): CKTOTAL, CKMB, CKMBINDEX, TROPONINI in the last 168 hours. BNP (last 3 results) No results for input(s): PROBNP in the last 8760 hours. HbA1C: No results for input(s): HGBA1C in the last 72 hours. CBG:  Recent Labs Lab 12/28/16 2223 12/29/16 0830 12/29/16 1213 12/29/16 1651  GLUCAP 110* 107* 86 99   Lipid Profile: No results  for input(s): CHOL, HDL, LDLCALC, TRIG, CHOLHDL, LDLDIRECT in the last 72 hours. Thyroid Function Tests: No results for input(s): TSH, T4TOTAL, FREET4, T3FREE, THYROIDAB in the last 72 hours. Anemia Panel: No results for input(s): VITAMINB12, FOLATE, FERRITIN, TIBC, IRON, RETICCTPCT in the last 72 hours. Urine analysis:    Component Value Date/Time   COLORURINE YELLOW 07/30/2012 1734   APPEARANCEUR CLEAR 07/30/2012 1734   LABSPEC 1.012 07/30/2012 1734   PHURINE 6.5 07/30/2012 1734   GLUCOSEU NEGATIVE 07/30/2012 1734   HGBUR MODERATE (A) 07/30/2012 1734   BILIRUBINUR NEGATIVE 07/30/2012 1734  KETONESUR NEGATIVE 07/30/2012 1734   PROTEINUR NEGATIVE 07/30/2012 1734   UROBILINOGEN 1.0 07/30/2012 1734   NITRITE NEGATIVE 07/30/2012 1734   LEUKOCYTESUR NEGATIVE 07/30/2012 1734   Sepsis Labs: @LABRCNTIP (procalcitonin:4,lacticidven:4)  ) Recent Results (from the past 240 hour(s))  MRSA PCR Screening     Status: Abnormal   Collection Time: 12/28/16  9:29 PM  Result Value Ref Range Status   MRSA by PCR POSITIVE (A) NEGATIVE Final    Comment:        The GeneXpert MRSA Assay (FDA approved for NASAL specimens only), is one component of a comprehensive MRSA colonization surveillance program. It is not intended to diagnose MRSA infection nor to guide or monitor treatment for MRSA infections. RESULT CALLED TO, READ BACK BY AND VERIFIED WITH: Adella Hare [id number] @ 0129 BY J SCOTTON       Radiology Studies: No results found.   Scheduled Meds: .  ceFAZolin (ANCEF) IV  2 g Intravenous Q8H  . Chlorhexidine Gluconate Cloth  6 each Topical Q0600  . diphenhydrAMINE  25 mg Oral Q8H  . enoxaparin (LOVENOX) injection  100 mg Subcutaneous QHS  . insulin aspart  0-20 Units Subcutaneous TID WC  . mupirocin ointment  1 application Nasal BID   Continuous Infusions:   LOS: 1 day    Time spent: 30 min    Renae Fickle, MD [facility] Pager [phone]  If 7PM-7AM,  please contact night-coverage [email or web address] Password [facility] 12/29/2016, 6:38 PM

## 2016-12-29 NOTE — Progress Notes (Signed)
Patient has home machine, mask and tubing. RT will call to have machine checked by biomed.

## 2016-12-30 ENCOUNTER — Other Ambulatory Visit: Payer: Self-pay | Admitting: Internal Medicine

## 2016-12-30 LAB — BASIC METABOLIC PANEL
Anion gap: 8 (ref 5–15)
BUN: 9 mg/dL (ref 6–20)
CALCIUM: 8.5 mg/dL — AB (ref 8.9–10.3)
CO2: 26 mmol/L (ref 22–32)
CREATININE: 0.43 mg/dL — AB (ref 0.44–1.00)
Chloride: 104 mmol/L (ref 101–111)
GFR calc Af Amer: >60 mL/min (ref >60)
GLUCOSE: 100 mg/dL — AB (ref 65–99)
Potassium: 3.5 mmol/L (ref 3.5–5.1)
Sodium: 138 mmol/L (ref 135–145)

## 2016-12-30 LAB — CBC
HCT: 35.2 % — ABNORMAL LOW (ref 36.0–46.0)
HEMOGLOBIN: 11.3 g/dL — AB (ref 12.0–15.0)
MCH: 29 pg (ref 26.0–34.0)
MCHC: 32.1 g/dL (ref 30.0–36.0)
MCV: 90.3 fL (ref 78.0–100.0)
PLATELETS: 188 10*3/uL (ref 150–400)
RBC: 3.9 MIL/uL (ref 3.87–5.11)
RDW: 14.7 % (ref 11.5–15.5)
WBC: 5.6 10*3/uL (ref 4.0–10.5)

## 2016-12-30 LAB — GLUCOSE, CAPILLARY
GLUCOSE-CAPILLARY: 86 mg/dL (ref 65–99)
GLUCOSE-CAPILLARY: 88 mg/dL (ref 65–99)
GLUCOSE-CAPILLARY: 80 mg/dL (ref 65–99)
Glucose-Capillary: 118 mg/dL — ABNORMAL HIGH (ref 65–99)

## 2016-12-30 LAB — HEMOGLOBIN A1C
HEMOGLOBIN A1C: 5.3 % (ref 4.8–5.6)
MEAN PLASMA GLUCOSE: 105 mg/dL

## 2016-12-30 MED ORDER — LINEZOLID 600 MG PO TABS: 600.0000 mg | ORAL_TABLET | Freq: Two times a day (BID) | ORAL | 0 refills | Status: DC

## 2016-12-30 MED FILL — LINEZOLID 600 MG TABLET: 600 | 10 days supply | Qty: 20 | Fill #0

## 2016-12-30 NOTE — Progress Notes (Addendum)
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 She has recently been on about monthly courses of oral antibiotics for right lower extremity cellulitis. She has been taking Keflex and doxycycline since 12/16/2016 but did not have improvement and she presented to the emergency department after the erythema spread from her medial right thigh down to her calf. She had chills.  She has been started on vancomycin and ceftriaxone. I discussed her care with infectious disease today and have changed her antibiotics to Ancef at their recommendation.  She is colonized with MRSA.  We discussed the possibility of oritavancin infusions as an outpatient.    Assessment & Plan:   Principal Problem:   Cellulitis of right lower extremity Active Problems:   Obstructive sleep apnea   Hyperglycemia   Cellulitis of right lower extremity -  Continue Ancef -  Infectious disease consulted for possible oritavancin infusions.   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  She only experience some redness on her upper arm on the side where her vancomycin infusion had been going in.  She had no itching, shortness of breath, wheezing, tongue or face swelling, nausea, or hives.   -  Dr. Drue Second looking into linezolid today -  Plan for discharge Wednesday on linezolid   Obstructive sleep apnea Continue supplemental oxygen and BiPAP ventilation at bedtime.    Hyperglycemia due to infection and stress.  A1c 5.3. -  D/c SSI and CBG  DVT prophylaxis:  Lovenox Code Status:  Full code Family  Communication:  Patient and her husband Disposition Plan:  Pending improvement in cellulitis   Consultants:   Infectious disease  Procedures:  None  Antimicrobials:  Anti-infectives    Start     Dose/Rate Route Frequency Ordered Stop   12/29/16 1800  ceFAZolin (ANCEF) IVPB 2g/100 mL premix     2 g 200 mL/hr over 30 Minutes Intravenous Every 8 hours 12/29/16 1327     12/29/16 0400  vancomycin (VANCOCIN) 1,500 mg in sodium chloride 0.9 % 500 mL IVPB  Status:  Discontinued     1,500 mg 166.7 mL/hr over 180 Minutes Intravenous Every 8 hours 12/28/16 2138 12/29/16 1318   12/28/16 2100  vancomycin (VANCOCIN) 2,000 mg in sodium chloride 0.9 % 500 mL IVPB     2,000 mg 166.7 mL/hr over 180 Minutes Intravenous  Once 12/28/16 1929 12/28/16 2333   12/28/16 2000  cefTRIAXone (ROCEPHIN) 2 g in dextrose 5 % 50 mL IVPB  Status:  Discontinued     2 g 100 mL/hr over 30 Minutes Intravenous  Once 12/28/16 1929 12/28/16 1930   12/28/16 2000  cefTRIAXone (ROCEPHIN) 2 g in dextrose 5 % 50 mL IVPB  Status:  Discontinued     2 g 100 mL/hr over 30 Minutes Intravenous Every 24 hours 12/28/16 1930 12/29/16 1318   12/28/16 1930  cefTRIAXone (ROCEPHIN) 1 g in dextrose 5 % 50 mL IVPB  Status:  Discontinued     1 g 100 mL/hr over 30 Minutes Intravenous  Once 12/28/16 1922 12/28/16 1929   12/28/16 1930  vancomycin (VANCOCIN) 2,000 mg in sodium chloride 0.9 % 500 mL  IVPB  Status:  Discontinued     2,000 mg 250 mL/hr over 120 Minutes Intravenous Every 12 hours 12/28/16 1922 12/28/16 1929       Subjective: Pinkness appears less intense and is starting to get better in thigh area.  Would like to go home today if possible.  Denies fevers and chills.    Objective: Vitals:   12/29/16 1508 12/29/16 2200 12/30/16 0536 12/30/16 1318  BP: 127/70 122/80 121/78 135/88  Pulse: (!) 107 96 88 97  Resp: 18 18 18 18   Temp: 98.3 F (36.8 C) 98 F (36.7 C) 98.7 F (37.1 C)   TempSrc: Oral Oral Oral   SpO2: 98% 97%  100% 100%  Weight:      Height:        Intake/Output Summary (Last 24 hours) at 12/30/16 1846 Last data filed at 12/30/16 1800  Gross per 24 hour  Intake              900 ml  Output                0 ml  Net              900 ml   Filed Weights   12/28/16 1929 12/28/16 2125  Weight: (!) 217.3 kg (479 lb) (!) 217.3 kg (479 lb)    Examination:  General exam:  Adult Female.  No acute distress.  HEENT:  NCAT, MMM Respiratory system: Clear to auscultation bilaterally Cardiovascular system: Regular rate and rhythm, normal S1/S2. No murmurs, rubs, gallops or clicks.  Warm extremities Gastrointestinal system: Normal active bowel sounds, soft, nondistended, nontender. MSK:  Normal tone and bulk, 1+ pitting bilateral lower extremity edema Skin: Right lower extremity erythema is lighter today and starting to recede from the thigh area.  Still prominent on the calf and lower extremity.  No purulence, vesicles, areas of obvious necrosis. Neuro:  Grossly intact    Data Reviewed: I have personally reviewed following labs and imaging studies  CBC:  Recent Labs Lab 12/28/16 1440 12/29/16 0508 12/30/16 0456  WBC 12.3* 6.3 5.6  NEUTROABS 10.9* 4.6  --   HGB 13.7 10.9* 11.3*  HCT 41.4 33.8* 35.2*  MCV 89.6 89.7 90.3  PLT 239 185 188   Basic Metabolic Panel:  Recent Labs Lab 12/28/16 1440 12/29/16 0508 12/30/16 0456  NA 137 138 138  K 3.6 3.5 3.5  CL 101 103 104  CO2 26 27 26   GLUCOSE 107* 126* 100*  BUN 11 11 9   CREATININE 0.59 0.54 0.43*  CALCIUM 9.2 8.2* 8.5*   GFR: Estimated Creatinine Clearance: 178.2 mL/min (by C-G formula based on SCr of 0.43 mg/dL (L)). Liver Function Tests:  Recent Labs Lab 12/28/16 1440 12/29/16 0508  AST 19 15  ALT 17 13*  ALKPHOS 55 43  BILITOT 0.9 0.7  PROT 8.4* 6.7  ALBUMIN 4.0 3.1*   No results for input(s): LIPASE, AMYLASE in the last 168 hours. No results for input(s): AMMONIA in the last 168 hours. Coagulation  Profile:  Recent Labs Lab 12/28/16 1440  INR 1.09   Cardiac Enzymes: No results for input(s): CKTOTAL, CKMB, CKMBINDEX, TROPONINI in the last 168 hours. BNP (last 3 results) No results for input(s): PROBNP in the last 8760 hours. HbA1C:  Recent Labs  12/29/16 0508  HGBA1C 5.3   CBG:  Recent Labs Lab 12/29/16 1651 12/29/16 2156 12/30/16 0756 12/30/16 1159 12/30/16 1625  GLUCAP 99 100* 86 88 80   Lipid Profile: No  results for input(s): CHOL, HDL, LDLCALC, TRIG, CHOLHDL, LDLDIRECT in the last 72 hours. Thyroid Function Tests: No results for input(s): TSH, T4TOTAL, FREET4, T3FREE, THYROIDAB in the last 72 hours. Anemia Panel: No results for input(s): VITAMINB12, FOLATE, FERRITIN, TIBC, IRON, RETICCTPCT in the last 72 hours. Urine analysis:    Component Value Date/Time   COLORURINE YELLOW 07/30/2012 1734   APPEARANCEUR CLEAR 07/30/2012 1734   LABSPEC 1.012 07/30/2012 1734   PHURINE 6.5 07/30/2012 1734   GLUCOSEU NEGATIVE 07/30/2012 1734   HGBUR MODERATE (A) 07/30/2012 1734   BILIRUBINUR NEGATIVE 07/30/2012 1734   KETONESUR NEGATIVE 07/30/2012 1734   PROTEINUR NEGATIVE 07/30/2012 1734   UROBILINOGEN 1.0 07/30/2012 1734   NITRITE NEGATIVE 07/30/2012 1734   LEUKOCYTESUR NEGATIVE 07/30/2012 1734   Sepsis Labs: @LABRCNTIP (procalcitonin:4,lacticidven:4)  ) Recent Results (from the past 240 hour(s))  MRSA PCR Screening     Status: Abnormal   Collection Time: 12/28/16  9:29 PM  Result Value Ref Range Status   MRSA by PCR POSITIVE (A) NEGATIVE Final    Comment:        The GeneXpert MRSA Assay (FDA approved for NASAL specimens only), is one component of a comprehensive MRSA colonization surveillance program. It is not intended to diagnose MRSA infection nor to guide or monitor treatment for MRSA infections. RESULT CALLED TO, READ BACK BY AND VERIFIED WITH: Adella HareMANDA LEMONS,RN [id number] @ 0129 BY J SCOTTON       Radiology Studies: No results  found.   Scheduled Meds: .  ceFAZolin (ANCEF) IV  2 g Intravenous Q8H  . Chlorhexidine Gluconate Cloth  6 each Topical Q0600  . diphenhydrAMINE  25 mg Oral Q8H  . enoxaparin (LOVENOX) injection  100 mg Subcutaneous QHS  . insulin aspart  0-20 Units Subcutaneous TID WC  . mupirocin ointment  1 application Nasal BID   Continuous Infusions:   LOS: 2 days    Time spent: 30 min    Renae FickleSHORT, Jennette Leask, MD [facility] Pager [phone]  If 7PM-7AM, please contact night-coverage [email or web address] Password [email or web address] 12/30/2016, 6:46 PM

## 2016-12-30 NOTE — Progress Notes (Signed)
Sending in rx for linezolid to see pts out of pocket cost for cellulitis

## 2016-12-30 NOTE — Progress Notes (Signed)
    [facility]    Date of Admission:  12/28/2016   Total days of antibiotics 3        Day 2 cefazolin           ID: Amber Kline is a 41 y.o. female with   Principal Problem:   Cellulitis of right lower extremity Active Problems:   Obstructive sleep apnea   Hyperglycemia    Subjective: Afebrile. Leg redness continues to improve  Medications:  .  ceFAZolin (ANCEF) IV  2 g Intravenous Q8H  . Chlorhexidine Gluconate Cloth  6 each Topical Q0600  . diphenhydrAMINE  25 mg Oral Q8H  . enoxaparin (LOVENOX) injection  100 mg Subcutaneous QHS  . insulin aspart  0-20 Units Subcutaneous TID WC  . mupirocin ointment  1 application Nasal BID    Objective: Vital signs in last 24 hours: Temp:  [98 F (36.7 C)-98.7 F (37.1 C)] 98.7 F (37.1 C) (01/16 0536) Pulse Rate:  [88-97] 97 (01/16 1318) Resp:  [18] 18 (01/16 1318) BP: (121-135)/(78-88) 135/88 (01/16 1318) SpO2:  [97 %-100 %] 100 % (01/16 1318) gen = a x o by in 3 in NAD HEENT = clear OP no signs of thrush, PERRLA, EOMI, no scleral icterus Skin = blanching erythema now just limited to calf, mild erythema to posterior thigh of right leg Ext = non pitting edema to lower extremity/calf area  Lab Results  Recent Labs  12/29/16 0508 12/30/16 0456  WBC 6.3 5.6  HGB 10.9* 11.3*  HCT 33.8* 35.2*  NA 138 138  K 3.5 3.5  CL 103 104  CO2 27 26  BUN 11 9  CREATININE 0.54 0.43*   Liver Panel  Recent Labs  12/28/16 1440 12/29/16 0508  PROT 8.4* 6.7  ALBUMIN 4.0 3.1*  AST 19 15  ALT 17 13*  ALKPHOS 55 43  BILITOT 0.9 0.7    Microbiology: none  Assessment/Plan: Recurrent right leg cellulitis with lymphedema  = can either do a dose of oritavancin vs. Doing linezolid which her insurance has covered before. I will send in rx to [facility] to see if her outpocket is reasonable. If so, would treat for 10 d with linezolid 600mg  PO BID.  Drue SecondSNIDER, [facility] HospitalCYNTHIA [facility] Cell: [phone] Pager: [phone]  12/30/2016, 4:48 PM

## 2016-12-31 DIAGNOSIS — G4733 Obstructive sleep apnea (adult) (pediatric): Secondary | ICD-10-CM

## 2016-12-31 DIAGNOSIS — L03115 Cellulitis of right lower limb: Principal | ICD-10-CM

## 2016-12-31 DIAGNOSIS — R739 Hyperglycemia, unspecified: Secondary | ICD-10-CM

## 2016-12-31 LAB — GLUCOSE, CAPILLARY
GLUCOSE-CAPILLARY: 94 mg/dL (ref 65–99)
Glucose-Capillary: 89 mg/dL (ref 65–99)
Glucose-Capillary: 88 mg/dL (ref 65–99)

## 2016-12-31 LAB — BASIC METABOLIC PANEL
Anion gap: 12 (ref 5–15)
BUN: 10 mg/dL (ref 6–20)
CO2: 19 mmol/L — ABNORMAL LOW (ref 22–32)
CREATININE: 0.54 mg/dL (ref 0.44–1.00)
Calcium: 8.4 mg/dL — ABNORMAL LOW (ref 8.9–10.3)
Chloride: 105 mmol/L (ref 101–111)
GFR calc Af Amer: >60 mL/min (ref >60)
GLUCOSE: 91 mg/dL (ref 65–99)
Potassium: 4.1 mmol/L (ref 3.5–5.1)
Sodium: 136 mmol/L (ref 135–145)

## 2016-12-31 LAB — CBC
HCT: 35.9 % — ABNORMAL LOW (ref 36.0–46.0)
Hemoglobin: 11.6 g/dL — ABNORMAL LOW (ref 12.0–15.0)
MCH: 28.8 pg (ref 26.0–34.0)
MCHC: 32.3 g/dL (ref 30.0–36.0)
MCV: 89.1 fL (ref 78.0–100.0)
Platelets: 208 10*3/uL (ref 150–400)
RBC: 4.03 MIL/uL (ref 3.87–5.11)
RDW: 14.3 % (ref 11.5–15.5)
WBC: 5.1 10*3/uL (ref 4.0–10.5)

## 2016-12-31 NOTE — Progress Notes (Signed)
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 She has recently been on about monthly courses of oral antibiotics for right lower extremity cellulitis. She has been taking Keflex and doxycycline since 12/16/2016 but did not have improvement and she presented to the emergency department after the erythema spread from her medial right thigh down to her calf. She had chills.  She has been started on vancomycin and ceftriaxone. I discussed her care with infectious disease today and have changed her antibiotics to Ancef at their recommendation.  She is colonized with MRSA.  We discussed the possibility of oritavancin infusions as an outpatient.  Assessment & Plan:   Principal Problem:   Cellulitis of right lower extremity Active Problems:   Obstructive sleep apnea   Hyperglycemia  Cellulitis of right lower extremity -  Patient continued on Ancef -  Infectious disease was consulted for possible oritavancin infusions.   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  She only experience some redness on her upper arm on the side where her vancomycin infusion had been going in.  She had no itching, shortness of breath, wheezing, tongue or face swelling, nausea, or hives.   -  Dr. Drue Second looking into linezolid today -  Plan for discharge Wednesday on linezolid   Obstructive sleep apnea -Stable -Patient to continue supplemental oxygen and BiPAP ventilation at bedtime.  Hyperglycemia due to infection and stress.  A1c 5.3. -  Will continue on SSI coverage  DVT prophylaxis: Lovenox  subQ Code Status: Full Family Communication: Pt in room, family no at bedside Disposition Plan: Uncertain at this time  Consultants:   ID  Procedures:     Antimicrobials: Anti-infectives    Start     Dose/Rate Route Frequency Ordered Stop   12/29/16 1800  ceFAZolin (ANCEF) IVPB 2g/100 mL premix     2 g 200 mL/hr over 30 Minutes Intravenous Every 8 hours 12/29/16 1327     12/29/16 0400  vancomycin (VANCOCIN) 1,500 mg in sodium chloride 0.9 % 500 mL IVPB  Status:  Discontinued     1,500 mg 166.7 mL/hr over 180 Minutes Intravenous Every 8 hours 12/28/16 2138 12/29/16 1318   12/28/16 2100  vancomycin (VANCOCIN) 2,000 mg in sodium chloride 0.9 % 500 mL IVPB     2,000 mg 166.7 mL/hr over 180 Minutes Intravenous  Once 12/28/16 1929 12/28/16 2333   12/28/16 2000  cefTRIAXone (ROCEPHIN) 2 g in dextrose 5 % 50 mL IVPB  Status:  Discontinued     2 g 100 mL/hr over 30 Minutes Intravenous  Once 12/28/16 1929 12/28/16 1930   12/28/16 2000  cefTRIAXone (ROCEPHIN) 2 g in dextrose 5 % 50 mL IVPB  Status:  Discontinued     2 g 100 mL/hr over 30 Minutes Intravenous Every 24 hours 12/28/16 1930 12/29/16 1318   12/28/16 1930  cefTRIAXone (ROCEPHIN) 1 g in dextrose 5 % 50 mL IVPB  Status:  Discontinued     1 g 100 mL/hr over 30 Minutes Intravenous  Once 12/28/16 1922 12/28/16 1929   12/28/16 1930  vancomycin (VANCOCIN) 2,000 mg in sodium chloride 0.9 % 500 mL IVPB  Status:  Discontinued     2,000 mg 250 mL/hr over 120 Minutes Intravenous Every 12 hours 12/28/16 1922 12/28/16 1929       Subjective: No complaints  Objective: Vitals:   12/30/16 1318 12/30/16 2120 12/31/16 0526 12/31/16 1507  BP: 135/88 123/67 120/67 134/63  Pulse: 97 92 78 95  Resp: 18 18 18 18   Temp:  98.3 F (36.8 C) 98 F (36.7 C) 98.3 F (36.8 C)  TempSrc:  Oral Oral Oral  SpO2: 100% 98% 97% 99%  Weight:      Height:        Intake/Output Summary (Last 24 hours) at 12/31/16 1529 Last data filed at 12/31/16  1507  Gross per 24 hour  Intake             1040 ml  Output             2900 ml  Net            -1860 ml   Filed Weights   12/28/16 1929 12/28/16 2125  Weight: (!) 217.3 kg (479 lb) (!) 217.3 kg (479 lb)    Examination:  General exam: Appears calm and comfortable  Respiratory system: Clear to auscultation. Respiratory effort normal. Cardiovascular system: S1 & S2 heard, RRR Gastrointestinal system: Abdomen is nondistended, soft and nontender. No organomegaly or masses felt. Normal bowel sounds heard. Central nervous system: Alert and oriented. No focal neurological deficits. Extremities: Symmetric 5 x 5 power. Skin: No rashes, lesions Psychiatry: Judgement and insight appear normal. Mood & affect appropriate.   Data Reviewed: I have personally reviewed following labs and imaging studies  CBC:  Recent Labs Lab 12/28/16 1440 12/29/16 0508 12/30/16 0456 12/31/16 0810  WBC 12.3* 6.3 5.6 5.1  NEUTROABS 10.9* 4.6  --   --   HGB 13.7 10.9* 11.3* 11.6*  HCT 41.4 33.8* 35.2* 35.9*  MCV 89.6 89.7 90.3 89.1  PLT 239 185 188 208   Basic Metabolic Panel:  Recent Labs Lab 12/28/16 1440 12/29/16 0508 12/30/16 0456 12/31/16 0457  NA 137 138 138 136  K 3.6 3.5 3.5 4.1  CL 101 103 104 105  CO2 26 27 26  19*  GLUCOSE 107* 126* 100* 91  BUN 11 11 9 10   CREATININE 0.59 0.54 0.43* 0.54  CALCIUM 9.2 8.2* 8.5* 8.4*   GFR: Estimated Creatinine Clearance: 178.2 mL/min (by C-G formula based on SCr of 0.54 mg/dL). Liver Function Tests:  Recent Labs Lab 12/28/16 1440 12/29/16 0508  AST 19 15  ALT 17 13*  ALKPHOS 55 43  BILITOT 0.9 0.7  PROT 8.4* 6.7  ALBUMIN 4.0 3.1*   No results for input(s): LIPASE, AMYLASE in the last 168 hours. No results for input(s): AMMONIA in the last 168 hours. Coagulation Profile:  Recent Labs Lab 12/28/16 1440  INR 1.09   Cardiac Enzymes: No results for input(s): CKTOTAL, CKMB, CKMBINDEX, TROPONINI in the last 168 hours. BNP (last 3  results) No results for input(s): PROBNP in the last 8760 hours. HbA1C:  Recent Labs  12/29/16 0508  HGBA1C 5.3   CBG:  Recent Labs Lab 12/30/16 1159 12/30/16 1625 12/30/16 2120 12/31/16 0757 12/31/16 1221  GLUCAP 88 80 118* 89 94   Lipid Profile: No results for input(s): CHOL, HDL, LDLCALC, TRIG, CHOLHDL, LDLDIRECT in the last 72 hours. Thyroid Function Tests: No results for input(s): TSH, T4TOTAL, FREET4, T3FREE, THYROIDAB in the last 72 hours. Anemia Panel: No results for input(s): VITAMINB12, FOLATE, FERRITIN, TIBC, IRON, RETICCTPCT in the last  72 hours. Sepsis Labs:  Recent Labs Lab 12/28/16 1451  LATICACIDVEN 1.52    Recent Results (from the past 240 hour(s))  MRSA PCR Screening     Status: Abnormal   Collection Time: 12/28/16  9:29 PM  Result Value Ref Range Status   MRSA by PCR POSITIVE (A) NEGATIVE Final    Comment:        The GeneXpert MRSA Assay (FDA approved for NASAL specimens only), is one component of a comprehensive MRSA colonization surveillance program. It is not intended to diagnose MRSA infection nor to guide or monitor treatment for MRSA infections. RESULT CALLED TO, READ BACK BY AND VERIFIED WITH: Adella HareMANDA LEMONS,RN [id number] @ 0129 BY J SCOTTON      Radiology Studies: No results found.  Scheduled Meds: .  ceFAZolin (ANCEF) IV  2 g Intravenous Q8H  . Chlorhexidine Gluconate Cloth  6 each Topical Q0600  . diphenhydrAMINE  25 mg Oral Q8H  . enoxaparin (LOVENOX) injection  100 mg Subcutaneous QHS  . mupirocin ointment  1 application Nasal BID   Continuous Infusions:   LOS: 3 days   Garth Diffley, Scheryl MartenSTEPHEN K, MD [facility] Pager [phone]  If 7PM-7AM, please contact night-coverage [email or web address] Password [email or web address] 12/31/2016, 3:29 PM

## 2017-01-01 LAB — CBC
HCT: 35.1 % — ABNORMAL LOW (ref 36.0–46.0)
Hemoglobin: 11.2 g/dL — ABNORMAL LOW (ref 12.0–15.0)
MCH: 28.6 pg (ref 26.0–34.0)
MCHC: 31.9 g/dL (ref 30.0–36.0)
MCV: 89.5 fL (ref 78.0–100.0)
PLATELETS: 240 10*3/uL (ref 150–400)
RBC: 3.92 MIL/uL (ref 3.87–5.11)
RDW: 14.4 % (ref 11.5–15.5)
WBC: 5.7 10*3/uL (ref 4.0–10.5)

## 2017-01-01 LAB — BASIC METABOLIC PANEL
ANION GAP: 11 (ref 5–15)
BUN: 12 mg/dL (ref 6–20)
CALCIUM: 8.7 mg/dL — AB (ref 8.9–10.3)
CO2: 25 mmol/L (ref 22–32)
CREATININE: 0.49 mg/dL (ref 0.44–1.00)
Chloride: 103 mmol/L (ref 101–111)
GFR calc Af Amer: >60 mL/min (ref >60)
GFR calc non Af Amer: >60 mL/min (ref >60)
Glucose, Bld: 103 mg/dL — ABNORMAL HIGH (ref 65–99)
Potassium: 3.8 mmol/L (ref 3.5–5.1)
SODIUM: 139 mmol/L (ref 135–145)

## 2017-01-01 MED ORDER — LINEZOLID 600 MG PO TABS: 600.0000 mg | ORAL_TABLET | Freq: Two times a day (BID) | ORAL | 0 refills | Status: DC

## 2017-01-01 NOTE — Discharge Summary (Signed)
Physician Discharge Summary  Amber Kline:[id number] DOB: 02-Feb-1976 DOA: 12/28/2016  PCP: Delbert Harness, MD  Admit date: 12/28/2016 Discharge date: 01/01/2017  Admitted From: Home Disposition:  Home  Recommendations for Outpatient Follow-up:  1. 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 She has recently been on about monthly courses of oral antibiotics for right lower extremity cellulitis. She has been taking Keflex and doxycycline since 12/16/2016 but did not have improvement and she presented to the emergency department after the erythema spread from her medial right thigh down to her calf. She had chills. She has been started on vancomycin and ceftriaxone. I discussed her care with infectious disease today and have changed her antibiotics to Ancef at their recommendation. She is colonized with MRSA. We discussed the possibility of oritavancin infusions as an outpatient.  Cellulitis of right lower extremity - Patient had been continued on Ancef - Infectious disease was consulted for possible oritavancin infusions.  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 She only experience some redness on her upper arm on the side where her vancomycin infusion had been going in. She had no itching, shortness of breath, wheezing, tongue or face swelling, nausea, or hives.  - Infectious Disease has determined that patient would be able to afford linezolid, with ID recommending 10 course of treatment   Obstructive sleep apnea -Stable -Patient to continue  supplemental oxygen and BiPAP ventilation at bedtime.  Hyperglycemia due to infection and stress. A1c 5.3. - Continued on SSI coverage  Discharge Diagnoses:  Principal Problem:   Cellulitis of right lower extremity Active Problems:   Obstructive sleep apnea   Hyperglycemia    Discharge Instructions   Allergies as of 01/01/2017      Reactions   Bactrim [sulfamethoxazole-trimethoprim] Hives   Sulfa Antibiotics Hives   Vancomycin Hives   Can take as long as benadryl is given prior to administration      Medication List    STOP taking these medications   cephALEXin 500 MG capsule Commonly known as:  KEFLEX   doxycycline 100 MG tablet Commonly known as:  VIBRA-TABS     TAKE these medications   ibuprofen 600 MG tablet Commonly known as:  ADVIL,MOTRIN Take 1 tablet (600 mg total) by mouth every 6 (six) hours as needed. What changed:  reasons to take this   linezolid 600 MG tablet Commonly known as:  ZYVOX Take 1 tablet (600 mg total) by mouth 2 (two) times daily.      Follow-up Information    Delbert Harness, MD. Schedule an appointment as soon as possible for a visit in 2 week(s).   Specialty:  Family Medicine Contact information: [address] [place] [address] [phone]          Allergies  Allergen Reactions  . Bactrim [Sulfamethoxazole-Trimethoprim] Hives  . Sulfa Antibiotics Hives  . Vancomycin Hives    Can take as long as benadryl is given prior to administration    Consultations:  ID  Procedures/Studies:  No results found.  Subjective: No complaints  Discharge Exam: Vitals:   12/31/16 2250 01/01/17 0548  BP: 102/61 120/71  Pulse: 82 75  Resp: 17  18  Temp: 98.4 F (36.9 C) 98.3 F (36.8 C)   Vitals:   12/31/16 0526 12/31/16 1507 12/31/16 2250 01/01/17 0548  BP: 120/67 134/63 102/61 120/71  Pulse: 78 95 82 75  Resp: 18 18 17 18   Temp: 98 F (36.7 C) 98.3 F (36.8 C) 98.4 F (36.9 C) 98.3 F (36.8  C)  TempSrc: Oral Oral Axillary Axillary  SpO2: 97% 99% 98% 99%  Weight:      Height:        General: Pt is alert, awake, not in acute distress Cardiovascular: RRR, S1/S2 +, no rubs, no gallops Respiratory: CTA bilaterally, no wheezing, no rhonchi Abdominal: Soft, NT, ND, bowel sounds + Extremities: no edema, no cyanosis   The results of significant diagnostics from this hospitalization (including imaging, microbiology, ancillary and laboratory) are listed below for reference.     Microbiology: Recent Results (from the past 240 hour(s))  MRSA PCR Screening     Status: Abnormal   Collection Time: 12/28/16  9:29 PM  Result Value Ref Range Status   MRSA by PCR POSITIVE (A) NEGATIVE Final    Comment:        The GeneXpert MRSA Assay (FDA approved for NASAL specimens only), is one component of a comprehensive MRSA colonization surveillance program. It is not intended to diagnose MRSA infection nor to guide or monitor treatment for MRSA infections. RESULT CALLED TO, READ BACK BY AND VERIFIED WITH: Adella Hare [id number] @ 0129 BY J SCOTTON      Labs: BNP (last 3 results) No results for input(s): BNP in the last 8760 hours. Basic Metabolic Panel:  Recent Labs Lab 12/28/16 1440 12/29/16 0508 12/30/16 0456 12/31/16 0457 01/01/17 0449  NA 137 138 138 136 139  K 3.6 3.5 3.5 4.1 3.8  CL 101 103 104 105 103  CO2 26 27 26  19* 25  GLUCOSE 107* 126* 100* 91 103*  BUN 11 11 9 10 12   CREATININE 0.59 0.54 0.43* 0.54 0.49  CALCIUM 9.2 8.2* 8.5* 8.4* 8.7*   Liver Function Tests:  Recent Labs Lab 12/28/16 1440 12/29/16 0508  AST 19 15  ALT 17 13*  ALKPHOS 55 43  BILITOT 0.9 0.7  PROT 8.4* 6.7  ALBUMIN 4.0 3.1*   No results for input(s): LIPASE, AMYLASE in the last 168 hours. No results for input(s): AMMONIA in the last 168 hours. CBC:  Recent Labs Lab 12/28/16 1440 12/29/16 0508 12/30/16 0456 12/31/16 0810 01/01/17 0449  WBC 12.3* 6.3 5.6 5.1 5.7   NEUTROABS 10.9* 4.6  --   --   --   HGB 13.7 10.9* 11.3* 11.6* 11.2*  HCT 41.4 33.8* 35.2* 35.9* 35.1*  MCV 89.6 89.7 90.3 89.1 89.5  PLT 239 185 188 208 240   Cardiac Enzymes: No results for input(s): CKTOTAL, CKMB, CKMBINDEX, TROPONINI in the last 168 hours. BNP: Invalid input(s): POCBNP CBG:  Recent Labs Lab 12/30/16 1625 12/30/16 2120 12/31/16 0757 12/31/16 1221 12/31/16 1703  GLUCAP 80 118* 89 94 88   D-Dimer No results for input(s): DDIMER in the last 72 hours. Hgb A1c No results for input(s): HGBA1C in the last 72 hours. Lipid Profile No results for input(s): CHOL, HDL, LDLCALC, TRIG, CHOLHDL, LDLDIRECT in the last 72 hours. Thyroid function studies No results for input(s): TSH, T4TOTAL, T3FREE, THYROIDAB in the last 72 hours.  Invalid input(s): FREET3 Anemia work up No results for input(s): VITAMINB12, FOLATE, FERRITIN, TIBC, IRON, RETICCTPCT in the last 72 hours. Urinalysis    Component  Value Date/Time   COLORURINE YELLOW 07/30/2012 1734   APPEARANCEUR CLEAR 07/30/2012 1734   LABSPEC 1.012 07/30/2012 1734   PHURINE 6.5 07/30/2012 1734   GLUCOSEU NEGATIVE 07/30/2012 1734   HGBUR MODERATE (A) 07/30/2012 1734   BILIRUBINUR NEGATIVE 07/30/2012 1734   KETONESUR NEGATIVE 07/30/2012 1734   PROTEINUR NEGATIVE 07/30/2012 1734   UROBILINOGEN 1.0 07/30/2012 1734   NITRITE NEGATIVE 07/30/2012 1734   LEUKOCYTESUR NEGATIVE 07/30/2012 1734   Sepsis Labs Invalid input(s): PROCALCITONIN,  WBC,  LACTICIDVEN Microbiology Recent Results (from the past 240 hour(s))  MRSA PCR Screening     Status: Abnormal   Collection Time: 12/28/16  9:29 PM  Result Value Ref Range Status   MRSA by PCR POSITIVE (A) NEGATIVE Final    Comment:        The GeneXpert MRSA Assay (FDA approved for NASAL specimens only), is one component of a comprehensive MRSA colonization surveillance program. It is not intended to diagnose MRSA infection nor to guide or monitor treatment for MRSA  infections. RESULT CALLED TO, READ BACK BY AND VERIFIED WITH: Adella HareMANDA LEMONS,RN [phone] @ 0129 BY J SCOTTON      SIGNED:   Jerald KiefHIU, Chinmay Squier K, MD  [facility] 01/01/2017, 11:17 AM  If 7PM-7AM, please contact night-coverage [email or web address] Password [email or web address]

## 2017-01-01 NOTE — Progress Notes (Signed)
Discharge instruction given to patient along with paper script.  Questions answered

## 2017-01-01 NOTE — Care Management Note (Signed)
Case Management Note  Patient Details  Name: Amber MohrRuth M Orozco-Martinez MRN: [id number] Date of Birth: 04-18-1976  Subjective/Objective:    40y.o. F admitted with cellulitis. Plan is to discharge today on po abx. Pt is aware of plan and agrees. No voiced questions or concerns.                 Action/Plan:CM will sign off for now but will be available should additional discharge needs arise or disposition change.    Expected Discharge Date:                  Expected Discharge Plan:  Home/Self Care  In-House Referral:  NA  Discharge planning Services  CM Consult  Post Acute Care Choice:  NA Choice offered to:  Patient, Spouse  DME Arranged:  N/A DME Agency:     HH Arranged:  NA HH Agency:  NA  Status of Service:  Completed, signed off  If discussed at Long Length of Stay Meetings, dates discussed:    Additional Comments:  Yvone NeuCrutchfield, Sharolyn Weber M, RN 01/01/2017, 11:14 AM

## 2017-01-11 ENCOUNTER — Ambulatory Visit (HOSPITAL_COMMUNITY)
Admission: EM | Admit: 2017-01-11 | Discharge: 2017-01-11 | Disposition: A | Discharge status: Home / Self Care | Payer: Managed Care, Other (non HMO) | Attending: Family Medicine | Admitting: Family Medicine

## 2017-01-11 ENCOUNTER — Encounter (HOSPITAL_COMMUNITY): Payer: Self-pay | Admitting: Emergency Medicine

## 2017-01-11 DIAGNOSIS — H6002 Abscess of left external ear: Secondary | ICD-10-CM

## 2017-01-11 MED ORDER — HYDROCODONE-ACETAMINOPHEN 5-325 MG PO TABS: 1.0000 | ORAL_TABLET | Freq: Four times a day (QID) | ORAL | 0 refills | Status: DC | PRN

## 2017-01-11 MED ORDER — CIPROFLOXACIN-DEXAMETHASONE 0.3-0.1 % OT SUSP: 4.0000 [drp] | Freq: Two times a day (BID) | OTIC | 0 refills | Status: DC

## 2017-01-11 NOTE — Discharge Instructions (Signed)
See ENT doctor asap for further care of ear.

## 2017-01-11 NOTE — ED Provider Notes (Signed)
[facility]    CSN: [id number] Arrival date & time: 01/11/17  1633     History   Chief Complaint No chief complaint on file.   HPI Amber Kline is a 41 y.o. female.   The history is provided by the patient.  Otalgia  Location:  Left Behind ear:  No abnormality Quality:  Throbbing and pressure Severity:  Moderate Onset quality:  Sudden Duration:  1 day Progression:  Worsening Chronicity:  New Worsened by:  Palpation Associated symptoms: ear discharge   Associated symptoms: no fever     Past Medical History:  Diagnosis Date  . Cellulitis ~ 2005; 03/2011; 07/2011; 07/30/12   LLE: LLE: RLE/pt; RLE "both times I have been hospitalized it's been for the right leg"  . Dysrhythmia 07/30/2012   tachycardia  . On home O2 07/30/2012   "sleep w/2L @ night"  . OSA (obstructive sleep apnea)    "severe; sleep w/oxygen and BiPAP"  . PONV (postoperative nausea and vomiting)     Patient Active Problem List   Diagnosis Date Noted  . Cellulitis and abscess of leg 03/10/2015  . Sepsis (HCC) 03/04/2015  . Cellulitis of right lower extremity 03/04/2015  . Obstructive sleep apnea 07/31/2012  . Lymphedema 07/31/2012  . Protein calorie malnutrition (HCC) 07/31/2012  . Hypokalemia 07/31/2012  . Microcytic anemia 07/31/2012  . Hematuria 07/31/2012  . Morbid obesity (HCC) 07/31/2012  . Irregular menses 07/31/2012  . Hyperglycemia 07/30/2012    Past Surgical History:  Procedure Laterality Date  . DILATATION & CURRETTAGE/HYSTEROSCOPY WITH RESECTOCOPE N/A 11/15/2015   Procedure: DILATATION & CURETTAGE/HYSTEROSCOPY WITH RESECTION OF ENDOMETRIAL POLYPS;  Surgeon: Silverio LaySandra Rivard, MD;  Location: [facility];  Service: Gynecology;  Laterality: N/A;  . DILATION AND CURETTAGE OF UTERUS    . HYSTEROSCOPY W/D&C  2006   for uterine polyp    OB History    No data available       Home Medications    Prior to Admission medications   Medication Sig Start Date End Date  Taking? Authorizing Provider  ciprofloxacin-dexamethasone (CIPRODEX) otic suspension Place 4 drops into the left ear 2 (two) times daily. 01/11/17   Linna HoffJames D Treniece Holsclaw, MD  HYDROcodone-acetaminophen (NORCO/VICODIN) 5-325 MG tablet Take 1-2 tablets by mouth every 6 (six) hours as needed. 01/11/17   Linna HoffJames D Yardley Beltran, MD  ibuprofen (ADVIL,MOTRIN) 600 MG tablet Take 1 tablet (600 mg total) by mouth every 6 (six) hours as needed. Patient taking differently: Take 600 mg by mouth every 6 (six) hours as needed for fever or headache.  11/15/15   Silverio LaySandra Rivard, MD  linezolid (ZYVOX) 600 MG tablet Take 1 tablet (600 mg total) by mouth 2 (two) times daily. 01/01/17   Jerald KiefStephen K Chiu, MD    Family History Family History  Problem Relation Age of Onset  . Hypercholesterolemia Mother   . Diabetes Mellitus II Father   . Hypercholesterolemia Father   . Hypertension Father     Social History Social History  Substance Use Topics  . Smoking status: Former Smoker    Years: 4.00    Types: Cigarettes    Quit date: 08/15/2009  . Smokeless tobacco: Never Used     Comment: 07/30/2012 "only smoked 3-4 cigarettes on the weekends when I was smoking"  . Alcohol use No     Comment: 07/30/2012 "have drank; never frequent; last drink ~ 2001"     Allergies   Bactrim [sulfamethoxazole-trimethoprim]; Sulfa antibiotics; and Vancomycin   Review of Systems Review  of Systems  Constitutional: Negative.  Negative for fever.  HENT: Positive for ear discharge and ear pain.      Physical Exam Triage Vital Signs ED Triage Vitals  Enc Vitals Group     BP 01/11/17 1821 138/97     Pulse Rate 01/11/17 1821 101     Resp 01/11/17 1821 24     Temp 01/11/17 1821 97.9 F (36.6 C)     Temp Source 01/11/17 1821 Oral     SpO2 01/11/17 1821 99 %     Weight --      Height --      Head Circumference --      Peak Flow --      Pain Score 01/11/17 1820 9     Pain Loc --      Pain Edu? --      Excl. in GC? --    No data  found.   Updated Vital Signs BP 138/97 (BP Location: Right Arm) Comment (BP Location): large cuff on forearm  Pulse 101   Temp 97.9 F (36.6 C) (Oral)   Resp 24   SpO2 99%   Visual Acuity Right Eye Distance:   Left Eye Distance:   Bilateral Distance:    Right Eye Near:   Left Eye Near:    Bilateral Near:     Physical Exam  Constitutional: She appears well-developed and well-nourished.  HENT:  Right Ear: Hearing, tympanic membrane, external ear and ear canal normal.  Left Ear: There is drainage, swelling and tenderness.  Ears:  Mouth/Throat: Oropharynx is clear and moist.     UC Treatments / Results  Labs (all labs ordered are listed, but only abnormal results are displayed) Labs Reviewed - No data to display  EKG  EKG Interpretation None       Radiology No results found.  Procedures Procedures (including critical care time)  Medications Ordered in UC Medications - No data to display   Initial Impression / Assessment and Plan / UC Course  I have reviewed the triage vital signs and the nursing notes.  Pertinent labs & imaging results that were available during my care of the patient were reviewed by me and considered in my medical decision making (see chart for details).       Final Clinical Impressions(s) / UC Diagnoses   Final diagnoses:  Abscess of ear canal, left    New Prescriptions New Prescriptions   CIPROFLOXACIN-DEXAMETHASONE (CIPRODEX) OTIC SUSPENSION    Place 4 drops into the left ear 2 (two) times daily.   HYDROCODONE-ACETAMINOPHEN (NORCO/VICODIN) 5-325 MG TABLET    Take 1-2 tablets by mouth every 6 (six) hours as needed.     Linna Hoff, MD 01/11/17 [phone]

## 2017-01-11 NOTE — ED Triage Notes (Signed)
Noticed "wet" ear yesterday.  Today woke with severe pain and it has progressively worsened.  Patient reports hearing is muffled in left ear

## 2017-01-19 ENCOUNTER — Inpatient Hospital Stay (HOSPITAL_COMMUNITY)
Admission: EM | Admit: 2017-01-19 | Discharge: 2017-01-21 | DRG: 872 | Disposition: A | Discharge status: Home / Self Care | Payer: Managed Care, Other (non HMO) | Attending: Internal Medicine | Admitting: Internal Medicine

## 2017-01-19 ENCOUNTER — Encounter (HOSPITAL_COMMUNITY): Payer: Self-pay | Admitting: Emergency Medicine

## 2017-01-19 DIAGNOSIS — Z6841 Body Mass Index (BMI) 40.0 and over, adult: Secondary | ICD-10-CM

## 2017-01-19 DIAGNOSIS — R000 Tachycardia, unspecified: Secondary | ICD-10-CM | POA: Diagnosis present

## 2017-01-19 DIAGNOSIS — I890 Lymphedema, not elsewhere classified: Secondary | ICD-10-CM

## 2017-01-19 DIAGNOSIS — A419 Sepsis, unspecified organism: Secondary | ICD-10-CM | POA: Diagnosis not present

## 2017-01-19 DIAGNOSIS — Z8249 Family history of ischemic heart disease and other diseases of the circulatory system: Secondary | ICD-10-CM | POA: Diagnosis not present

## 2017-01-19 DIAGNOSIS — G4733 Obstructive sleep apnea (adult) (pediatric): Secondary | ICD-10-CM | POA: Diagnosis present

## 2017-01-19 DIAGNOSIS — Z22322 Carrier or suspected carrier of Methicillin resistant Staphylococcus aureus: Secondary | ICD-10-CM | POA: Diagnosis not present

## 2017-01-19 DIAGNOSIS — Z833 Family history of diabetes mellitus: Secondary | ICD-10-CM

## 2017-01-19 DIAGNOSIS — Z881 Allergy status to other antibiotic agents status: Secondary | ICD-10-CM | POA: Diagnosis not present

## 2017-01-19 DIAGNOSIS — Z882 Allergy status to sulfonamides status: Secondary | ICD-10-CM | POA: Diagnosis not present

## 2017-01-19 DIAGNOSIS — L03116 Cellulitis of left lower limb: Secondary | ICD-10-CM | POA: Diagnosis present

## 2017-01-19 DIAGNOSIS — Z87891 Personal history of nicotine dependence: Secondary | ICD-10-CM

## 2017-01-19 DIAGNOSIS — L03119 Cellulitis of unspecified part of limb: Secondary | ICD-10-CM | POA: Diagnosis present

## 2017-01-19 LAB — COMPREHENSIVE METABOLIC PANEL
ALK PHOS: 52 U/L (ref 38–126)
ALT: 21 U/L (ref 14–54)
ANION GAP: 11 (ref 5–15)
AST: 27 U/L (ref 15–41)
Albumin: 4.5 g/dL (ref 3.5–5.0)
BILIRUBIN TOTAL: 0.9 mg/dL (ref 0.3–1.2)
BUN: 9 mg/dL (ref 6–20)
CALCIUM: 9 mg/dL (ref 8.9–10.3)
CO2: 25 mmol/L (ref 22–32)
Chloride: 99 mmol/L — ABNORMAL LOW (ref 101–111)
Creatinine, Ser: 0.73 mg/dL (ref 0.44–1.00)
GLUCOSE: 123 mg/dL — AB (ref 65–99)
Potassium: 3.6 mmol/L (ref 3.5–5.1)
Sodium: 135 mmol/L (ref 135–145)
Total Protein: 8.9 g/dL — ABNORMAL HIGH (ref 6.5–8.1)

## 2017-01-19 LAB — CBC WITH DIFFERENTIAL/PLATELET
BASOS PCT: 0 %
Basophils Absolute: 0 10*3/uL (ref 0.0–0.1)
Eosinophils Absolute: 0 10*3/uL (ref 0.0–0.7)
Eosinophils Relative: 0 %
HEMATOCRIT: 40 % (ref 36.0–46.0)
Hemoglobin: 12.8 g/dL (ref 12.0–15.0)
Lymphocytes Relative: 2 %
Lymphs Abs: 0.4 10*3/uL — ABNORMAL LOW (ref 0.7–4.0)
MCH: 28.1 pg (ref 26.0–34.0)
MCHC: 32 g/dL (ref 30.0–36.0)
MCV: 87.9 fL (ref 78.0–100.0)
MONO ABS: 0.6 10*3/uL (ref 0.1–1.0)
Monocytes Relative: 3 %
NEUTROS ABS: 15.8 10*3/uL — AB (ref 1.7–7.7)
NEUTROS PCT: 95 %
Platelets: 161 10*3/uL (ref 150–400)
RBC: 4.55 MIL/uL (ref 3.87–5.11)
RDW: 14.8 % (ref 11.5–15.5)
WBC: 16.8 10*3/uL — ABNORMAL HIGH (ref 4.0–10.5)

## 2017-01-19 LAB — I-STAT CG4 LACTIC ACID, ED: LACTIC ACID, VENOUS: 1.86 mmol/L (ref 0.5–1.9)

## 2017-01-19 MED ORDER — SODIUM CHLORIDE 0.9 % IV SOLN
1000.0000 mL | INTRAVENOUS | Status: DC
  Administered 2017-01-19 – 2017-01-21 (×5): 1000 mL via INTRAVENOUS

## 2017-01-19 MED ORDER — ACETAMINOPHEN 500 MG PO TABS
1000.0000 mg | ORAL_TABLET | Freq: Once | ORAL | Status: AC
  Administered 2017-01-19: 1000 mg via ORAL
  Filled 2017-01-19: qty 2

## 2017-01-19 MED ORDER — SODIUM CHLORIDE 0.9 % IV SOLN
1000.0000 mg | INTRAVENOUS | Status: DC
  Administered 2017-01-19: 1000 mg via INTRAVENOUS
  Filled 2017-01-19: qty 20

## 2017-01-19 MED ORDER — ACETAMINOPHEN 325 MG PO TABS
650.0000 mg | ORAL_TABLET | ORAL | Status: DC | PRN
  Administered 2017-01-19 – 2017-01-20 (×3): 650 mg via ORAL
  Filled 2017-01-19 (×3): qty 2

## 2017-01-19 MED ORDER — HEPARIN SODIUM (PORCINE) 5000 UNIT/ML IJ SOLN
5000.0000 [IU] | Freq: Three times a day (TID) | INTRAMUSCULAR | Status: DC
  Administered 2017-01-19 – 2017-01-21 (×6): 5000 [IU] via SUBCUTANEOUS
  Filled 2017-01-19 (×6): qty 1

## 2017-01-19 MED ORDER — SODIUM CHLORIDE 0.9 % IV SOLN
500.0000 mg | INTRAVENOUS | Status: DC
  Administered 2017-01-20 – 2017-01-21 (×2): 500 mg via INTRAVENOUS
  Filled 2017-01-19 (×4): qty 10

## 2017-01-19 NOTE — Progress Notes (Signed)
Pharmacy Antibiotic Note  Amber MohrRuth M Kline is a 41 y.o. female admitted on 01/19/2017 with cellulitis.  Pharmacy has been consulted for daptomycin dosing.   Patient is a 41 year old female with history of recurrent lower leg cellulitis, morbid obesity and OSA who presents the ED with complaints of left lower leg cellulitis. She was last admitted to the hospital 1/14 for recurrent cellulitis to her right lower leg. She states she was discharged home on the 18th on a 10 day prescription of Flonase lid which she reports taking as prescribed.   Addendum from early daptomycin note:  Plan:  The mfg recommends using TBW, but there is some evidence to support using adjusted BW in the very morbidly obese patients.  Patient given daptomycin 1gm x 1 then will reduce dose to 500mg  IV q24h per adjusted BW.    CK with AM labs then qweekly  Weight: (!) 479 lb (217.3 kg)  Temp (24hrs), Avg:100.3 F (37.9 C), Min:98.6 F (37 C), Max:103.3 F (39.6 C)   Recent Labs Lab 01/19/17 1343 01/19/17 1443  WBC 16.8*  --   CREATININE 0.73  --   LATICACIDVEN  --  1.86    Estimated Creatinine Clearance: 178.2 mL/min (by C-G formula based on SCr of 0.73 mg/dL).    Allergies  Allergen Reactions  . Bactrim [Sulfamethoxazole-Trimethoprim] Hives  . Sulfa Antibiotics Hives  . Vancomycin Hives    Can take as long as benadryl is given prior to administration    Antimicrobials this admission: 2/5 daptomycin >>   Dose adjustments this admission: ---  Microbiology results: 2/5 BCx: sent   Thank you for allowing pharmacy to be a part of this patient's care.   Juliette Alcideustin Tiondra Fang, PharmD, BCPS.   Pager: [phone] 01/19/2017 6:52 PM

## 2017-01-19 NOTE — Progress Notes (Signed)
Pharmacy Antibiotic Note  Amber MohrRuth M Kline is a 41 y.o. female admitted on 01/19/2017 with cellulitis.  Pharmacy has been consulted for daptomycin dosing.   Patient is a 41 year old female with history of recurrent lower leg cellulitis, morbid obesity and OSA who presents the ED with complaints of left lower leg cellulitis. She was last admitted to the hospital 1/14 for recurrent cellulitis to her right lower leg. She states she was discharged home on the 18th on a 10 day prescription of Flonase lid which she reports taking as prescribed.  Plan:  Daptomycin ( 4mg /kg) 1000 mg IV q24h   CK with AM labs  Weight: (!) 479 lb (217.3 kg)  Temp (24hrs), Avg:101.2 F (38.4 C), Min:99.1 F (37.3 C), Max:103.3 F (39.6 C)   Recent Labs Lab 01/19/17 1343 01/19/17 1443  WBC 16.8*  --   CREATININE 0.73  --   LATICACIDVEN  --  1.86    Estimated Creatinine Clearance: 178.2 mL/min (by C-G formula based on SCr of 0.73 mg/dL).    Allergies  Allergen Reactions  . Bactrim [Sulfamethoxazole-Trimethoprim] Hives  . Sulfa Antibiotics Hives  . Vancomycin Hives    Can take as long as benadryl is given prior to administration    Antimicrobials this admission: 2/5 daptomycin >>   Dose adjustments this admission: ---  Microbiology results: 2/5 BCx: sent   Thank you for allowing pharmacy to be a part of this patient's care.   Amber Kline, PharmD, BCPS Pager [phone] 01/19/2017 2:56 PM

## 2017-01-19 NOTE — ED Notes (Signed)
RN @ bedside with ultra sound IV

## 2017-01-19 NOTE — ED Triage Notes (Signed)
Per EMS pt here for evaluation of left lower leg cellulitis and fever. Pt hx of same.

## 2017-01-19 NOTE — ED Notes (Signed)
ED Provider at bedside. 

## 2017-01-19 NOTE — ED Notes (Signed)
Did not see nor feel anything to stick.Unable to collect labs

## 2017-01-19 NOTE — ED Provider Notes (Signed)
[facility]-EMERGENCY DEPT Provider Note   CSN: [id number] Arrival date & time: 01/19/17  1230     History   Chief Complaint Chief Complaint  Patient presents with  . Cellulitis  . Fever    HPI PIA Amber Kline is a 41 y.o. female.  HPI   Patient is a 41 year old female with history of recurrent lower leg cellulitis, morbid obesity and OSA who presents the ED with complaints of left lower leg cellulitis. Patient reports waking up around 3 AM this morning with reported chills, body aches, pain, redness and swelling to her left lower leg. Patient reports her symptoms worsen throughout the day resulting her going to her PCP who immediately sent her to the ED for further evaluation and management due to patient being febrile and tachycardic. Patient reports she was last admitted to the hospital 1/14 for recurrent cellulitis to her right lower leg. She states she was discharged home on the 18th on a 10 day prescription of Flonase lid which she reports taking as prescribed. Patient also reports having constant waxing and waning dizziness that started this morning when she got out of bed this morning. Patient describes her dizziness as "a little bit of lightheadedness that also I feel like the room is spinning". Patient reports having similar dizziness in the past when she was septic. She also notes that she has been treated for otitis externa of bilateral ears over the past week with improvement of your pain. Denies headache, visual changes, any tinnitus, loss of hearing, cough, shortness of breath, chest pain, abdominal pain, vomiting, diarrhea, urinary symptoms, drainage, numbness, tingling, weakness.  PCP- Dr. Earnest Bailey  Past Medical History:  Diagnosis Date  . Cellulitis ~ 2005; 03/2011; 07/2011; 07/30/12   LLE: LLE: RLE/pt; RLE "both times I have been hospitalized it's been for the right leg"  . Dysrhythmia 07/30/2012   tachycardia  . On home O2 07/30/2012   "sleep w/2L @ night"  . OSA  (obstructive sleep apnea)    "severe; sleep w/oxygen and BiPAP"  . PONV (postoperative nausea and vomiting)     Patient Active Problem List   Diagnosis Date Noted  . 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 Cellulitis and abscess of leg 03/10/2015  . Sepsis (HCC) 03/04/2015  . Cellulitis of right lower extremity 03/04/2015  . Obstructive sleep apnea 07/31/2012  . Lymphedema 07/31/2012  . Protein calorie malnutrition (HCC) 07/31/2012  . Hypokalemia 07/31/2012  . Microcytic anemia 07/31/2012  . Hematuria 07/31/2012  . Morbid obesity (HCC) 07/31/2012  . Irregular menses 07/31/2012  . Hyperglycemia 07/30/2012    Past Surgical History:  Procedure Laterality Date  . DILATATION & CURRETTAGE/HYSTEROSCOPY WITH RESECTOCOPE N/A 11/15/2015   Procedure: DILATATION & CURETTAGE/HYSTEROSCOPY WITH RESECTION OF ENDOMETRIAL POLYPS;  Surgeon: Silverio Lay, MD;  Location: [facility];  Service: Gynecology;  Laterality: N/A;  . DILATION AND CURETTAGE OF UTERUS    . HYSTEROSCOPY W/D&C  2006   for uterine polyp    OB History    No data available       Home Medications    Prior to Admission medications   Medication Sig Start Date End Date Taking? Authorizing Provider  ciprofloxacin-dexamethasone (CIPRODEX) otic suspension Place 4 drops into the left ear 2 (two) times daily. 01/11/17  Yes Linna Hoff, MD  ibuprofen (ADVIL,MOTRIN) 600 MG tablet Take 1 tablet (600 mg total) by mouth every 6 (six) hours as needed. Patient taking differently: Take 600 mg by mouth every 6 (six) hours as needed  for fever or headache.  11/15/15  Yes Silverio LaySandra Rivard, MD  HYDROcodone-acetaminophen (NORCO/VICODIN) 5-325 MG tablet Take 1-2 tablets by mouth every 6 (six) hours as needed. 01/11/17   Linna HoffJames D Kindl, MD  linezolid (ZYVOX) 600 MG tablet Take 1 tablet (600 mg total) by mouth 2 (two) times daily. Patient not taking: Reported on 01/19/2017 01/01/17   Jerald KiefStephen K Chiu, MD  norethindrone (AYGESTIN) 5 MG tablet Take 5  mg by mouth daily. 11/12/16   Historical Provider, MD    Family History Family History  Problem Relation Age of Onset  . Hypercholesterolemia Mother   . Diabetes Mellitus II Father   . Hypercholesterolemia Father   . Hypertension Father     Social History Social History  Substance Use Topics  . Smoking status: Former Smoker    Years: 4.00    Types: Cigarettes    Quit date: 08/15/2009  . Smokeless tobacco: Never Used     Comment: 07/30/2012 "only smoked 3-4 cigarettes on the weekends when I was smoking"  . Alcohol use No     Comment: 07/30/2012 "have drank; never frequent; last drink ~ 2001"     Allergies   Bactrim [sulfamethoxazole-trimethoprim]; Sulfa antibiotics; and Vancomycin   Review of Systems Review of Systems  Constitutional: Positive for chills and fever.  Musculoskeletal: Positive for myalgias (generalized).  Skin: Positive for color change (redness).  Neurological: Positive for dizziness.  All other systems reviewed and are negative.    Physical Exam Updated Vital Signs BP (!) 105/54   Pulse 108   Temp (!) 103.3 F (39.6 C) (Rectal)   Resp 20   Wt (!) 217.3 kg   SpO2 94%   BMI 84.85 kg/m   Physical Exam  Constitutional: She is oriented to person, place, and time. She appears well-developed and well-nourished.  Morbidly obese female  HENT:  Head: Normocephalic and atraumatic.  Right Ear: Tympanic membrane normal.  Left Ear: Tympanic membrane normal.  Mouth/Throat: Oropharynx is clear and moist. No oropharyngeal exudate.  Eyes: Conjunctivae and EOM are normal. Pupils are equal, round, and reactive to light. Right eye exhibits no discharge. Left eye exhibits no discharge. No scleral icterus.  Neck: Normal range of motion. Neck supple.  Cardiovascular: Regular rhythm, normal heart sounds and intact distal pulses.   Tachycardic, HR 115  Pulmonary/Chest: Effort normal and breath sounds normal. No respiratory distress. She has no wheezes. She has no  rales. She exhibits no tenderness.  Abdominal: Soft. Bowel sounds are normal. She exhibits no distension and no mass. There is no tenderness. There is no rebound and no guarding. No hernia.  Musculoskeletal: Normal range of motion. She exhibits tenderness. She exhibits no edema or deformity.  Neurological: She is alert and oriented to person, place, and time.  Skin: Skin is warm and dry. Capillary refill takes less than 2 seconds. There is erythema.  Large area of erythema, warmth and mild swelling noted to left lower leg extending from ankle to knee. No induration, fluctuance or drainage present. Mild TTP.   Nursing note and vitals reviewed.      ED Treatments / Results  Labs (all labs ordered are listed, but only abnormal results are displayed) Labs Reviewed  COMPREHENSIVE METABOLIC PANEL - Abnormal; Notable for the following:       Result Value   Chloride 99 (*)    Glucose, Bld 123 (*)    Total Protein 8.9 (*)    All other components within normal limits  CBC WITH DIFFERENTIAL/PLATELET -  Abnormal; Notable for the following:    WBC 16.8 (*)    Neutro Abs 15.8 (*)    Lymphs Abs 0.4 (*)    All other components within normal limits  CULTURE, BLOOD (ROUTINE X 2)  CULTURE, BLOOD (ROUTINE X 2)  I-STAT CG4 LACTIC ACID, ED    EKG  EKG Interpretation None       Radiology No results found.  Procedures Procedures (including critical care time)  Medications Ordered in ED Medications  0.9 %  sodium chloride infusion (1,000 mLs Intravenous New Bag/Given 01/19/17 1458)  DAPTOmycin (CUBICIN) 1,000 mg in sodium chloride 0.9 % IVPB (1,000 mg Intravenous Given 01/19/17 1559)  acetaminophen (TYLENOL) tablet 1,000 mg (1,000 mg Oral Given 01/19/17 1454)     Initial Impression / Assessment and Plan / ED Course  I have reviewed the triage vital signs and the nursing notes.  Pertinent labs & imaging results that were available during my care of the patient were reviewed by me and  considered in my medical decision making (see chart for details).     Patient presents with cellulitis to left lower extremity that started this morning and has progressed throughout the day. Hx of of recurrent lower leg cellulitis, morbid obesity and OSA. Initial vitals revealed rectal temp 103.3, heart rate 115; remaining vitals stable. Exam revealed large area of erythema, warmth and mild swelling noted to left lower leg extending from ankle to knee. No induration, fluctuance or drainage present. Mild TTP. Remaining exam unremarkable. Sepsis protocol initiated. Pt given IVF. Consulted ID regarding antibiotic choice due to pt with hx of recurrent cellulitis with MRSA colonizations and allergy to vanc. Dr. Orvan Falconer advised to given pt IV Daptomycin. Labs showed WBC 16.8, lactic acid 1.86. Discussed results and plan for admission with pt. Consulted hospitalist. Dr. Alvino Chapel agrees to admission. Orders placed for inpatient med-surg bed.   Sepsis - Repeat Assessment  Performed at:    4:30pm  Vitals     Blood pressure (!) 105/54, pulse 110, temperature (!) 103.3 F (39.6 C), temperature source Rectal, resp. rate 20, weight (!) 217.3 kg, SpO2 94 %.  Heart:     Tachycardic  Lungs:    CTA  Capillary Refill:   <2 sec  Peripheral Pulse:   Dorsalis pedis pulse  palpable  Skin:     Normal Color     Final Clinical Impressions(s) / ED Diagnoses   Final diagnoses:  Recurrent cellulitis of lower extremity    New Prescriptions New Prescriptions   No medications on file     Barrett Henle, PA-C 01/19/17 1632    Canary Brim Tegeler, MD 01/20/17 [phone]

## 2017-01-19 NOTE — H&P (Signed)
History and Physical    Amber Kline NWG:[id number] DOB: 1976/07/05 DOA: 01/19/2017  PCP: Delbert Harness, MD  Patient coming from: PCP office, resides at home with significant other at baseline  Chief Complaint: left leg infection  HPI: Amber Kline is a 41 y.o. female with medical history significant of recurrently lower extremities cellulitis (states > 11 episodes in the past year), chronic lymphedema, morbid obesity, severe obstructive sleep apnea on BiPAP who comes to [facility] with complaints of left leg infection. She was recently in the hospital for right leg cellulitis and was discharged on linezolid. She states that her right leg cellulitis has now completely resolved. However, at 3am this morning, she was awakened with pain in her left leg consistent with previous pain with cellulitis episodes. She went into her PCP office and was sent directly to ED for admission and IV antibiotic treatment. She admits to subjective fevers, chills, rigors, some cough, LLE swelling, pain, erythema without gross drainage or focal abscess. She denies any CP SOB N/V/D abdominal pain or dysuria.    ED Course: EDP called ID and they recommended daptomycin as antibiotic.   Review of Systems: As per HPI otherwise 10 point review of systems negative.   Past Medical History:  Diagnosis Date  . Cellulitis ~ 2005; 03/2011; 07/2011; 07/30/12   LLE: LLE: RLE/pt; RLE "both times I have been hospitalized it's been for the right leg"  . Dysrhythmia 07/30/2012   tachycardia  . On home O2 07/30/2012   "sleep w/2L @ night"  . OSA (obstructive sleep apnea)    "severe; sleep w/oxygen and BiPAP"  . PONV (postoperative nausea and vomiting)     Past Surgical History:  Procedure Laterality Date  . DILATATION & CURRETTAGE/HYSTEROSCOPY WITH RESECTOCOPE N/A 11/15/2015   Procedure: DILATATION & CURETTAGE/HYSTEROSCOPY WITH RESECTION OF ENDOMETRIAL POLYPS;  Surgeon: Silverio Lay, MD;  Location: [facility]  ORS;  Service: Gynecology;  Laterality: N/A;  . DILATION AND CURETTAGE OF UTERUS    . HYSTEROSCOPY W/D&C  2006   for uterine polyp     reports that she quit smoking about 7 years ago. Her smoking use included Cigarettes. She quit after 4.00 years of use. She has never used smokeless tobacco. She reports that she does not drink alcohol or use drugs.  Allergies  Allergen Reactions  . Bactrim [Sulfamethoxazole-Trimethoprim] Hives  . Sulfa Antibiotics Hives  . Vancomycin Hives    Can take as long as benadryl is given prior to administration    Family History  Problem Relation Age of Onset  . Hypercholesterolemia Mother   . Diabetes Mellitus II Father   . Hypercholesterolemia Father   . Hypertension Father     Prior to Admission medications   Medication Sig Start Date End Date Taking? Authorizing Provider  ciprofloxacin-dexamethasone (CIPRODEX) otic suspension Place 4 drops into the left ear 2 (two) times daily. 01/11/17  Yes Linna Hoff, MD  ibuprofen (ADVIL,MOTRIN) 600 MG tablet Take 1 tablet (600 mg total) by mouth every 6 (six) hours as needed. Patient taking differently: Take 600 mg by mouth every 6 (six) hours as needed for fever or headache.  11/15/15  Yes Silverio Lay, MD  HYDROcodone-acetaminophen (NORCO/VICODIN) 5-325 MG tablet Take 1-2 tablets by mouth every 6 (six) hours as needed. 01/11/17   Linna Hoff, MD  linezolid (ZYVOX) 600 MG tablet Take 1 tablet (600 mg total) by mouth 2 (two) times daily. Patient not taking: Reported on 01/19/2017 01/01/17   Jerald Kief,  MD  norethindrone (AYGESTIN) 5 MG tablet Take 5 mg by mouth daily. 11/12/16   Historical Provider, MD    Physical Exam: Vitals:   01/19/17 1530 01/19/17 1532 01/19/17 1545 01/19/17 1600  BP: 118/60 118/60  (!) 105/54  Pulse: 113 114 108   Resp:  20    Temp:      TempSrc:      SpO2: 99% 99% 94%   Weight:         Constitutional: NAD, calm, comfortable, morbidly obese  Eyes: PERRL, lids and  conjunctivae normal ENMT: Mucous membranes are moist. Posterior pharynx clear of any exudate or lesions.Normal dentition.  Neck: normal, supple, no masses, no thyromegaly Respiratory: clear to auscultation bilaterally, no wheezing, no crackles. Normal respiratory effort. No accessory muscle use.  Cardiovascular: Regular rate and rhythm, no murmurs / rubs / gallops. 2+ pedal pulses. No carotid bruits.  Abdomen: no tenderness, no masses palpated. No hepatosplenomegaly. Bowel sounds positive.  Musculoskeletal: no clubbing / cyanosis. Symmetric bilaterally, bilateral LE with significant lymphedema and chronic skin changes  Skin: LLE with erythema, warm to touch, without any drainage or open wounds noted  Neurologic: CN 2-12 grossly intact. Sensation intact, DTR normal. Strength 5/5 in all 4.  Psychiatric: Normal judgment and insight. Alert and oriented x 3. Normal mood.   Labs on Admission: I have personally reviewed following labs and imaging studies  CBC:  Recent Labs Lab 01/19/17 1343  WBC 16.8*  NEUTROABS 15.8*  HGB 12.8  HCT 40.0  MCV 87.9  PLT 161   Basic Metabolic Panel:  Recent Labs Lab 01/19/17 1343  NA 135  K 3.6  CL 99*  CO2 25  GLUCOSE 123*  BUN 9  CREATININE 0.73  CALCIUM 9.0   GFR: Estimated Creatinine Clearance: 178.2 mL/min (by C-G formula based on SCr of 0.73 mg/dL). Liver Function Tests:  Recent Labs Lab 01/19/17 1343  AST 27  ALT 21  ALKPHOS 52  BILITOT 0.9  PROT 8.9*  ALBUMIN 4.5   No results for input(s): LIPASE, AMYLASE in the last 168 hours. No results for input(s): AMMONIA in the last 168 hours. Coagulation Profile: No results for input(s): INR, PROTIME in the last 168 hours. Cardiac Enzymes: No results for input(s): CKTOTAL, CKMB, CKMBINDEX, TROPONINI in the last 168 hours. BNP (last 3 results) No results for input(s): PROBNP in the last 8760 hours. HbA1C: No results for input(s): HGBA1C in the last 72 hours. CBG: No results for  input(s): GLUCAP in the last 168 hours. Lipid Profile: No results for input(s): CHOL, HDL, LDLCALC, TRIG, CHOLHDL, LDLDIRECT in the last 72 hours. Thyroid Function Tests: No results for input(s): TSH, T4TOTAL, FREET4, T3FREE, THYROIDAB in the last 72 hours. Anemia Panel: No results for input(s): VITAMINB12, FOLATE, FERRITIN, TIBC, IRON, RETICCTPCT in the last 72 hours. Urine analysis:    Component Value Date/Time   COLORURINE YELLOW 07/30/2012 1734   APPEARANCEUR CLEAR 07/30/2012 1734   LABSPEC 1.012 07/30/2012 1734   PHURINE 6.5 07/30/2012 1734   GLUCOSEU NEGATIVE 07/30/2012 1734   HGBUR MODERATE (A) 07/30/2012 1734   BILIRUBINUR NEGATIVE 07/30/2012 1734   KETONESUR NEGATIVE 07/30/2012 1734   PROTEINUR NEGATIVE 07/30/2012 1734   UROBILINOGEN 1.0 07/30/2012 1734   NITRITE NEGATIVE 07/30/2012 1734   LEUKOCYTESUR NEGATIVE 07/30/2012 1734   Sepsis Labs: !!!!!!!!!!!!!!!!!!!!!!!!!!!!!!!!!!!!!!!!!!!! @LABRCNTIP (procalcitonin:4,lacticidven:4) )No results found for this or any previous visit (from the past 240 hour(s)).   Radiological Exams on Admission: No results found.  Assessment/Plan Principal Problem:   Recurrent cellulitis  of lower extremity Active Problems:   Obstructive sleep apnea   Lymphedema   Morbid obesity (HCC)   Sepsis (HCC)  Sepsis secondary to left lower extremity cellulitis -History of recurrent lower extremity cellulitis, recently discharged from hospital with linezolid for right lower extremity cellulitis. Also with history of Redman syndrome with vanco  -EDP spoke with ID who recommended daptomycin -Blood cultures pending  -IVF  Obstructive sleep apnea -BiPAP at nighttime   DVT prophylaxis: subq hep Code Status: Full  Family Communication: no family at bedside Disposition Plan: pending further improvement, suspect return to home Consults called: EDP called ID for antibiotic recommendations Admission status: Inpatient for IV antibiotic therapy    Noralee Stain, DO [facility] [email or web address] Password [facility] 01/19/2017, 4:23 PM

## 2017-01-19 NOTE — ED Notes (Signed)
Failed attmpt to collect labs 

## 2017-01-20 LAB — BASIC METABOLIC PANEL
ANION GAP: 8 (ref 5–15)
BUN: 8 mg/dL (ref 6–20)
CALCIUM: 8.4 mg/dL — AB (ref 8.9–10.3)
CHLORIDE: 103 mmol/L (ref 101–111)
CO2: 24 mmol/L (ref 22–32)
Creatinine, Ser: 0.62 mg/dL (ref 0.44–1.00)
GFR calc Af Amer: >60 mL/min (ref >60)
GFR calc non Af Amer: >60 mL/min (ref >60)
GLUCOSE: 107 mg/dL — AB (ref 65–99)
POTASSIUM: 3.6 mmol/L (ref 3.5–5.1)
Sodium: 135 mmol/L (ref 135–145)

## 2017-01-20 LAB — CK: CK TOTAL: 87 U/L (ref 38–234)

## 2017-01-20 LAB — CBC
HCT: 34 % — ABNORMAL LOW (ref 36.0–46.0)
HEMOGLOBIN: 11.2 g/dL — AB (ref 12.0–15.0)
MCH: 29.2 pg (ref 26.0–34.0)
MCHC: 32.9 g/dL (ref 30.0–36.0)
MCV: 88.5 fL (ref 78.0–100.0)
Platelets: 126 10*3/uL — ABNORMAL LOW (ref 150–400)
RBC: 3.84 MIL/uL — AB (ref 3.87–5.11)
RDW: 15.3 % (ref 11.5–15.5)
WBC: 8.6 10*3/uL (ref 4.0–10.5)

## 2017-01-20 NOTE — Progress Notes (Signed)
Pt brought in home CPAP.  RT inspected machine for damages and no obvious defects were noted.  Pt's machine is working properly at this time.  Pt will self administer when ready for bed.  RT to monitor and assess as needed.

## 2017-01-20 NOTE — Progress Notes (Signed)
PROGRESS NOTE    Amber Kline  ZHY:[id number] DOB: Mar 12, 1976 DOA: 01/19/2017 PCP: Delbert Harness, MD     Brief Narrative:  Amber Kline is a 41 y.o. female with medical history significant of recurrently lower extremities cellulitis (states > 11 episodes in the past year), chronic lymphedema, morbid obesity, severe obstructive sleep apnea on BiPAP who comes to [facility] with complaints of left leg infection. She was recently in the hospital for right leg cellulitis and was discharged on linezolid. She states that her right leg cellulitis has now completely resolved. However, at 3am on day of admission, she was awakened with pain in her left leg consistent with previous pain with cellulitis episodes. She went into her PCP office and was sent directly to ED for admission and IV antibiotic treatment. She admits to subjective fevers, chills, rigors, some cough, LLE swelling, pain, erythema without gross drainage or focal abscess. EDP called ID and they recommended daptomycin.    Assessment & Plan:   Principal Problem:   Recurrent cellulitis of lower extremity Active Problems:   Obstructive sleep apnea   Lymphedema   Morbid obesity (HCC)   Sepsis (HCC)  Sepsis secondary to left lower extremity cellulitis -History of recurrent lower extremity cellulitis, recently discharged from hospital with linezolid for right lower extremity cellulitis. Also with history of Redman syndrome with vanco  -Last admission was recommended that she follow with outpatient lymphedema clinic at [facility], but has not done so yet  -EDP spoke with ID who recommended daptomycin -Blood cultures pending  -IVF  Obstructive sleep apnea -BiPAP at nighttime   DVT prophylaxis: lovenox  Code Status: full Family Communication: no family at bedside Disposition Plan: pending further improvement, suspect return home    Consultants:   ID over the phone by EDP  Procedures:    None  Antimicrobials:   Daptomycin     Subjective: Feeling well this morning. No complaints. Denies fever this morning and overall feeling like she is improving from infection stand point. Left leg looks more red, but states that her cellulitis always looks worse before better.    Objective: Vitals:   01/20/17 0123 01/20/17 0545 01/20/17 0858 01/20/17 0925  BP:  (!) 127/55  (!) 117/48  Pulse:  (!) 105  (!) 108  Resp:  (!) 23  15  Temp: 99 F (37.2 C) (!) 101.1 F (38.4 C) 99 F (37.2 C) 99.2 F (37.3 C)  TempSrc:  Oral Oral Oral  SpO2:  100%  97%  Weight:        Intake/Output Summary (Last 24 hours) at 01/20/17 1219 Last data filed at 01/20/17 1140  Gross per 24 hour  Intake          2319.17 ml  Output             2400 ml  Net           -80.83 ml   Filed Weights   01/19/17 1234  Weight: (!) 217.3 kg (479 lb)    Examination:  General exam: Appears calm and comfortable, morbidly obese  Respiratory system: Clear to auscultation. Respiratory effort normal. Cardiovascular system: S1 & S2 heard, RRR. No JVD, murmurs, rubs, gallops or clicks. No pedal edema. Gastrointestinal system: Abdomen is nondistended, soft and nontender. No organomegaly or masses felt. Normal bowel sounds heard. Central nervous system: Alert and oriented. No focal neurological deficits. Extremities: Symmetric bilaterally, bilateral LE with significant lymphedema and chronic skin changes  Skin: LLE with  erythema, warm to touch, without any drainage or open wounds noted  Psychiatry: Judgement and insight appear normal. Mood & affect appropriate.   Data Reviewed: I have personally reviewed following labs and imaging studies  CBC:  Recent Labs Lab 01/19/17 1343 01/20/17 0528  WBC 16.8* 8.6  NEUTROABS 15.8*  --   HGB 12.8 11.2*  HCT 40.0 34.0*  MCV 87.9 88.5  PLT 161 126*   Basic Metabolic Panel:  Recent Labs Lab 01/19/17 1343 01/20/17 0528  NA 135 135  K 3.6 3.6  CL 99* 103  CO2  25 24  GLUCOSE 123* 107*  BUN 9 8  CREATININE 0.73 0.62  CALCIUM 9.0 8.4*   GFR: Estimated Creatinine Clearance: 178.2 mL/min (by C-G formula based on SCr of 0.62 mg/dL). Liver Function Tests:  Recent Labs Lab 01/19/17 1343  AST 27  ALT 21  ALKPHOS 52  BILITOT 0.9  PROT 8.9*  ALBUMIN 4.5   No results for input(s): LIPASE, AMYLASE in the last 168 hours. No results for input(s): AMMONIA in the last 168 hours. Coagulation Profile: No results for input(s): INR, PROTIME in the last 168 hours. Cardiac Enzymes:  Recent Labs Lab 01/20/17 0528  CKTOTAL 87   BNP (last 3 results) No results for input(s): PROBNP in the last 8760 hours. HbA1C: No results for input(s): HGBA1C in the last 72 hours. CBG: No results for input(s): GLUCAP in the last 168 hours. Lipid Profile: No results for input(s): CHOL, HDL, LDLCALC, TRIG, CHOLHDL, LDLDIRECT in the last 72 hours. Thyroid Function Tests: No results for input(s): TSH, T4TOTAL, FREET4, T3FREE, THYROIDAB in the last 72 hours. Anemia Panel: No results for input(s): VITAMINB12, FOLATE, FERRITIN, TIBC, IRON, RETICCTPCT in the last 72 hours. Sepsis Labs:  Recent Labs Lab 01/19/17 1443  LATICACIDVEN 1.86    No results found for this or any previous visit (from the past 240 hour(s)).     Radiology Studies: No results found.    Scheduled Meds: . DAPTOmycin (CUBICIN)  IV  500 mg Intravenous Q24H  . heparin  5,000 Units Subcutaneous Q8H   Continuous Infusions: . sodium chloride 1,000 mL (01/20/17 0855)     LOS: 1 day    Time spent: 30 minutes   Noralee StainJennifer Richelle Glick, DO [facility] [email or web address] Password [email or web address] 01/20/2017, 12:19 PM

## 2017-01-21 DIAGNOSIS — L03119 Cellulitis of unspecified part of limb: Secondary | ICD-10-CM

## 2017-01-21 DIAGNOSIS — A419 Sepsis, unspecified organism: Principal | ICD-10-CM

## 2017-01-21 DIAGNOSIS — I890 Lymphedema, not elsewhere classified: Secondary | ICD-10-CM

## 2017-01-21 DIAGNOSIS — G4733 Obstructive sleep apnea (adult) (pediatric): Secondary | ICD-10-CM

## 2017-01-21 LAB — CBC WITH DIFFERENTIAL/PLATELET
BASOS PCT: 0 %
Basophils Absolute: 0 10*3/uL (ref 0.0–0.1)
EOS ABS: 0 10*3/uL (ref 0.0–0.7)
Eosinophils Relative: 1 %
HEMATOCRIT: 33 % — AB (ref 36.0–46.0)
HEMOGLOBIN: 10.6 g/dL — AB (ref 12.0–15.0)
LYMPHS ABS: 1 10*3/uL (ref 0.7–4.0)
Lymphocytes Relative: 19 %
MCH: 27.7 pg (ref 26.0–34.0)
MCHC: 32.1 g/dL (ref 30.0–36.0)
MCV: 86.4 fL (ref 78.0–100.0)
Monocytes Absolute: 0.6 10*3/uL (ref 0.1–1.0)
Monocytes Relative: 12 %
NEUTROS ABS: 3.5 10*3/uL (ref 1.7–7.7)
NEUTROS PCT: 68 %
Platelets: 132 10*3/uL — ABNORMAL LOW (ref 150–400)
RBC: 3.82 MIL/uL — AB (ref 3.87–5.11)
RDW: 15.2 % (ref 11.5–15.5)
WBC: 5.1 10*3/uL (ref 4.0–10.5)

## 2017-01-21 LAB — BASIC METABOLIC PANEL
Anion gap: 7 (ref 5–15)
BUN: 7 mg/dL (ref 6–20)
CHLORIDE: 107 mmol/L (ref 101–111)
CO2: 24 mmol/L (ref 22–32)
Calcium: 8.3 mg/dL — ABNORMAL LOW (ref 8.9–10.3)
Creatinine, Ser: 0.55 mg/dL (ref 0.44–1.00)
GFR calc non Af Amer: >60 mL/min (ref >60)
Glucose, Bld: 105 mg/dL — ABNORMAL HIGH (ref 65–99)
POTASSIUM: 3.2 mmol/L — AB (ref 3.5–5.1)
SODIUM: 138 mmol/L (ref 135–145)

## 2017-01-21 MED ORDER — LINEZOLID 600 MG PO TABS: 600.0000 mg | ORAL_TABLET | Freq: Two times a day (BID) | ORAL | 0 refills | Status: AC

## 2017-01-21 MED ORDER — IBUPROFEN 600 MG PO TABS: 600.0000 mg | ORAL_TABLET | Freq: Four times a day (QID) | ORAL | Status: AC | PRN

## 2017-01-21 MED FILL — LINEZOLID 600 MG TABLET: 600 | 10 days supply | Qty: 20 | Fill #0

## 2017-01-21 NOTE — Discharge Summary (Signed)
Physician Discharge Summary  Amber MohrRuth M Kline WUJ:[id number] DOB: 12-20-1975 DOA: 01/19/2017  PCP: Delbert HarnessBRISCOE, KIM, MD  Admit date: 01/19/2017 Discharge date: 01/21/2017  Time spent: 35 minutes  Recommendations for Outpatient Follow-up:  1. Repeat CBC to follow WBC's trend  2. Please make sure patient had close follow up with lymphedema clinic    Discharge Diagnoses:  Principal Problem:   Recurrent cellulitis of lower extremity Active Problems:   Obstructive sleep apnea   Lymphedema   Morbid obesity (HCC)   Sepsis (HCC)   Discharge Condition: stable and improved. Discharge home with instructions to follow up with PCP in 10 days.  Diet recommendation: low calorie diet   Filed Weights   01/19/17 1234  Weight: (!) 217.3 kg (479 lb)    History of present illness:  41 y.o.femalewith medical history significant of recurrently lower extremities cellulitis (states >11 episodes in the past year), chronic lymphedema, morbid obesity, severe obstructive sleep apnea on BiPAP who comes to [facility] with complaints of left leg infection. She was recently in the hospital for right leg cellulitis and was discharged on linezolid. She states that her right leg cellulitis has now completely resolved. However, at 3am on day of admission, she was awakened with pain in her left leg consistent with previous pain with cellulitis episodes. She went into her PCP office and was sent directly to ED for admission and IV antibiotic treatment. She admits to subjective fevers, chills, rigors, some cough, LLE swelling, pain, erythema without gross drainage or focal abscess.   Hospital Course:  1-Sepsis due to LLE cellulitis/lymphadenitis  -patient with chronic lymphedema and recurrent cellulitic process -received 3 days of IV daptomycin as recommended by ID service  -patient sepsis resolved and at discharge recommendations/plans are to complete a total of 10 of antibiotics using zyvox -patient need  acute referral to lymphedema clinic (which according to patient is in the process to happen by her PCP) -advise to keep leg elevated  -blood cx's remained neg up to date -WBC's at discharge 5.1, patient afebrile and with normal HR  2-Obstructive sleep Apnea -weight loss encourage -continue CPAP QHS  3-morbid obesity  -Body mass index is 84.85 kg/m. -low calorie diet and exercise discussed with patient -needs follow up with bariatric clinic   4-leukocytosis -due to #1 -resolved with IV antibiotics and IVF's -will recommend CBC to follow WBC's trend   Procedures:  None   Consultations:  ID (Dr. Orvan Falconerampbell)  Discharge Exam: Vitals:   01/21/17 0618 01/21/17 1421  BP: 121/61 (!) 155/87  Pulse: 93 (!) 106  Resp: 18 18  Temp: 99.1 F (37.3 C) 98.8 F (37.1 C)   General exam: Appears calm and comfortable, morbidly obese in no acute distress. Afebrile, denies CP and SOB. Respiratory system: Clear to auscultation. Respiratory effort normal. Cardiovascular system: S1 & S2 heard, RRR. No JVD, murmurs, rubs, gallops or clicks. No pedal edema. Gastrointestinal system: Abdomen is Obese, soft and nontender. No organomegaly or masses felt. Normal bowel sounds heard. Central nervous system: Alert and oriented. No focal neurological deficits. Extremities: Symmetric bilaterally, bilateral LE with significant lymphedema and chronic skin changes, decrease warmness and erythema on exam.  Skin: LLE with improved erythema and warmness to touch; No drainage or open wounds noted  Psychiatry: Judgement and insight appear normal. Mood & affect appropriate.   Discharge Instructions    Current Discharge Medication List    START taking these medications   Details  linezolid (ZYVOX) 600 MG tablet Take 1 tablet (  600 mg total) by mouth 2 (two) times daily. Qty: 20 tablet, Refills: 0      CONTINUE these medications which have CHANGED   Details  ibuprofen (ADVIL,MOTRIN) 600 MG tablet Take 1  tablet (600 mg total) by mouth every 6 (six) hours as needed for fever or headache.      CONTINUE these medications which have NOT CHANGED   Details  norethindrone (AYGESTIN) 5 MG tablet Take 5 mg by mouth daily.      STOP taking these medications     ciprofloxacin-dexamethasone (CIPRODEX) otic suspension      HYDROcodone-acetaminophen (NORCO/VICODIN) 5-325 MG tablet        Allergies  Allergen Reactions  . Bactrim [Sulfamethoxazole-Trimethoprim] Hives  . Sulfa Antibiotics Hives  . Vancomycin Hives    Can take as long as benadryl is given prior to administration   Follow-up Information    Delbert Harness, MD Follow up in 10 day(s).   Specialty:  Family Medicine Contact information: [address] [place] [address] [phone]           The results of significant diagnostics from this hospitalization (including imaging, microbiology, ancillary and laboratory) are listed below for reference.    Significant Diagnostic Studies: No results found.  Microbiology: Recent Results (from the past 240 hour(s))  Blood Culture (routine x 2)     Status: None (Preliminary result)   Collection Time: 01/19/17  2:35 PM  Result Value Ref Range Status   Specimen Description BLOOD LEFT FOREARM  Final   Special Requests BOTTLES DRAWN AEROBIC AND ANAEROBIC 5CC  Final   Culture   Final    NO GROWTH 2 DAYS Performed at [facility], [address] [place], [place] [address]    Report Status PENDING  Incomplete  Blood Culture (routine x 2)     Status: None (Preliminary result)   Collection Time: 01/19/17  4:41 PM  Result Value Ref Range Status   Specimen Description BLOOD RIGHT ANTECUBITAL  Final   Special Requests BOTTLES DRAWN AEROBIC AND ANAEROBIC 5 CC EACH  Final   Culture   Final    NO GROWTH 2 DAYS Performed at [facility], [address], [place], [place] [address]    Report Status PENDING  Incomplete     Labs: Basic Metabolic  Panel:  Recent Labs Lab 01/19/17 1343 01/20/17 0528 01/21/17 0516  NA 135 135 138  K 3.6 3.6 3.2*  CL 99* 103 107  CO2 25 24 24   GLUCOSE 123* 107* 105*  BUN 9 8 7   CREATININE 0.73 0.62 0.55  CALCIUM 9.0 8.4* 8.3*   Liver Function Tests:  Recent Labs Lab 01/19/17 1343  AST 27  ALT 21  ALKPHOS 52  BILITOT 0.9  PROT 8.9*  ALBUMIN 4.5   CBC:  Recent Labs Lab 01/19/17 1343 01/20/17 0528 01/21/17 0516  WBC 16.8* 8.6 5.1  NEUTROABS 15.8*  --  3.5  HGB 12.8 11.2* 10.6*  HCT 40.0 34.0* 33.0*  MCV 87.9 88.5 86.4  PLT 161 126* 132*   Cardiac Enzymes:  Recent Labs Lab 01/20/17 0528  CKTOTAL 87    Signed:  Vassie Loll MD.  [facility] 01/21/2017, 3:46 PM

## 2017-01-21 NOTE — Progress Notes (Signed)
Patient resting in bed. No complaints of pain.

## 2017-01-21 NOTE — Progress Notes (Signed)
Patient discharged home with husband and friends with no complaints. Discharge instructions given; Rx for Linezolid called in to [facility]; Signs and symptoms of worsening infection reviewed with patient; patient reminded to follow up with PCP. Patient verbalizes understanding.

## 2017-01-24 LAB — CULTURE, BLOOD (ROUTINE X 2)
Culture: NO GROWTH
Culture: NO GROWTH

## 2018-07-21 ENCOUNTER — Emergency Department (HOSPITAL_COMMUNITY): Payer: Managed Care, Other (non HMO)

## 2018-07-21 ENCOUNTER — Emergency Department (HOSPITAL_COMMUNITY)
Admission: EM | Admit: 2018-07-21 | Discharge: 2018-07-21 | Disposition: A | Discharge status: Home / Self Care | Payer: Managed Care, Other (non HMO) | Attending: Emergency Medicine | Admitting: Emergency Medicine

## 2018-07-21 ENCOUNTER — Other Ambulatory Visit: Payer: Self-pay

## 2018-07-21 ENCOUNTER — Encounter (HOSPITAL_COMMUNITY): Payer: Self-pay

## 2018-07-21 DIAGNOSIS — Z79899 Other long term (current) drug therapy: Secondary | ICD-10-CM | POA: Insufficient documentation

## 2018-07-21 DIAGNOSIS — Y999 Unspecified external cause status: Secondary | ICD-10-CM | POA: Insufficient documentation

## 2018-07-21 DIAGNOSIS — S8992XA Unspecified injury of left lower leg, initial encounter: Secondary | ICD-10-CM | POA: Diagnosis present

## 2018-07-21 DIAGNOSIS — Z87891 Personal history of nicotine dependence: Secondary | ICD-10-CM | POA: Diagnosis not present

## 2018-07-21 DIAGNOSIS — Z6841 Body Mass Index (BMI) 40.0 and over, adult: Secondary | ICD-10-CM | POA: Diagnosis not present

## 2018-07-21 DIAGNOSIS — Y9301 Activity, walking, marching and hiking: Secondary | ICD-10-CM | POA: Insufficient documentation

## 2018-07-21 DIAGNOSIS — M25562 Pain in left knee: Secondary | ICD-10-CM | POA: Diagnosis not present

## 2018-07-21 DIAGNOSIS — I890 Lymphedema, not elsewhere classified: Secondary | ICD-10-CM | POA: Diagnosis not present

## 2018-07-21 DIAGNOSIS — W1830XA Fall on same level, unspecified, initial encounter: Secondary | ICD-10-CM | POA: Diagnosis not present

## 2018-07-21 DIAGNOSIS — Y929 Unspecified place or not applicable: Secondary | ICD-10-CM | POA: Insufficient documentation

## 2018-07-21 DIAGNOSIS — R9389 Abnormal findings on diagnostic imaging of other specified body structures: Secondary | ICD-10-CM

## 2018-07-21 MED ORDER — HYDROCODONE-ACETAMINOPHEN 5-325 MG PO TABS: 1.0000 | ORAL_TABLET | Freq: Four times a day (QID) | ORAL | 0 refills | Status: AC | PRN

## 2018-07-21 MED ORDER — HYDROCODONE-ACETAMINOPHEN 5-325 MG PO TABS
1.0000 | ORAL_TABLET | Freq: Once | ORAL | Status: AC
  Administered 2018-07-21: 1 via ORAL
  Filled 2018-07-21: qty 1

## 2018-07-21 NOTE — ED Triage Notes (Signed)
EMS reports from home, fall 0530 in bathroom, pt states she heard left knee pop, attempted to come to ER independently, and knee gave out and fell again. N LOC no visible injury or other pain.  BP 136/90 HR 100 Resp 24 Sp02

## 2018-07-21 NOTE — ED Notes (Signed)
Bed: [id number] Expected date: 07/21/18 Expected time: 8:40 AM Means of arrival: Ambulance Comments: EMS 42 yo fall, knee pain

## 2018-07-21 NOTE — Discharge Instructions (Addendum)
Your xray shows there is discontinuity in osteophyte extending off of the medial distal femur which may represent fracture through the osteophyte as I showed you on the xray. Please where the knee immobilizer until you are able to follow-up with orthopedics.  I would like you to follow-up with orthopedics as soon as possible.  Please give them a call today to schedule a follow-up appointment.  Please elevate your leg above the level of your heart.  Please remain nonweightbearing till follow up.   HOW TO MAKE AN ICE PACK  To make an ice pack, do one of the following:  Place crushed ice or a bag of frozen vegetables in a sealable plastic bag. Squeeze out the excess air. Place this bag inside another plastic bag. Slide the bag into a pillowcase or place a damp towel between your skin and the bag.  Mix 3 parts water with 1 part rubbing alcohol. Freeze the mixture in a sealable plastic bag. When you remove the mixture from the freezer, it will be slushy. Squeeze out the excess air. Place this bag inside another plastic bag. Slide the bag into a pillowcase or place a damp towel between your skin and the ice pack.    For breakthrough pain you may take Norco. Do not drink alcohol drive or operate heavy machinery when taking. You are being provided a prescription for opiates (also known as narcotics) for pain control on an ?as needed? basis.  Opiates can be addictive and should only be used when absolutely necessary for pain control when other alternatives do not work.  We recommend you only use them for the recommended amount of time and only as prescribed.  Please do not take with other sedative medications or alcohol.  Please do not drive, operate machinery, or make important decisions while taking opiates.  Please note that these medications can be addictive and have high abuse potential.  Please keep these medications locked away from children, teenagers or any family members with history of substance abuse.  Additionally, these medications may cause constipation - take over the counter stool softeners or add fiber to your diet to treat this (Metamucil, Psyllium Fiber, Colace, Miralax) Further refills will need to be obtained from your primary care doctor and will not be prescribed through the Emergency Department. You will test positive on most drug tests while taking this medication.   Please return if you have any numbness of the lower extremity.  If you have any skin changes such as your lower extremity turning white/blue please loosen the brace and elevate your leg and return to the emergency department for further evaluation.

## 2018-07-21 NOTE — ED Notes (Signed)
Bed: WHALB Expected date:  Expected time:  Means of arrival:  Comments: 

## 2018-07-21 NOTE — Plan of Care (Signed)
Wrapped patient in ace bandage.

## 2018-07-21 NOTE — ED Provider Notes (Signed)
[facility]-EMERGENCY DEPT Provider Note   CSN: [id number] Arrival date & time: 07/21/18  0840     History   Chief Complaint Chief Complaint  Patient presents with  . Fall  . Leg Pain    HPI Amber Kline is a [age] y.o. female with history of significant lymphedema to the lower extremities, morbid obesity who presents emergency department today for left knee pain.  Patient reports that this morning she was walking when she fell and landed onto her left knee.  This occurred around 5:30 AM this morning.  When she was ambulating to her car she felt like her left knee popped and gave way on her.  She notes that she is unable to bear weight on the knee since that time.  She was transferred to EMS.  No pain medication prior to arrival.  She notes that movement and palpation make her symptoms worse.  Her pain is generalized and constant.  No open wounds, deformity, numbness/tingling or weakness.  She denies prior injuries or surgeries to the knee.  HPI  Past Medical History:  Diagnosis Date  . Cellulitis ~ 2005; 03/2011; 07/2011; 07/30/12   LLE: LLE: RLE/pt; RLE "both times I have been hospitalized it's been for the right leg"  . Dysrhythmia 07/30/2012   tachycardia  . On home O2 07/30/2012   "sleep w/2L @ night"  . OSA (obstructive sleep apnea)    "severe; sleep w/oxygen and BiPAP"  . PONV (postoperative nausea and vomiting)     Patient Active Problem List   Diagnosis Date Noted  . 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 Sepsis (HCC) 03/04/2015  . Obstructive sleep apnea 07/31/2012  . Lymphedema 07/31/2012  . Protein calorie malnutrition (HCC) 07/31/2012  . Microcytic anemia 07/31/2012  . Hematuria 07/31/2012  . Morbid obesity (HCC) 07/31/2012  . Irregular menses 07/31/2012    Past Surgical History:  Procedure Laterality Date  . DILATATION & CURRETTAGE/HYSTEROSCOPY WITH RESECTOCOPE N/A 11/15/2015   Procedure: DILATATION &  CURETTAGE/HYSTEROSCOPY WITH RESECTION OF ENDOMETRIAL POLYPS;  Surgeon: Silverio LaySandra Rivard, MD;  Location: [facility] ORS;  Service: Gynecology;  Laterality: N/A;  . DILATION AND CURETTAGE OF UTERUS    . HYSTEROSCOPY W/D&C  2006   for uterine polyp     OB History   None      Home Medications    Prior to Admission medications   Medication Sig Start Date End Date Taking? Authorizing Provider  ibuprofen (ADVIL,MOTRIN) 600 MG tablet Take 1 tablet (600 mg total) by mouth every 6 (six) hours as needed for fever or headache. 01/21/17   Vassie LollMadera, Carlos, MD  linezolid (ZYVOX) 600 MG tablet Take 1 tablet (600 mg total) by mouth 2 (two) times daily. 01/21/17   Vassie LollMadera, Carlos, MD  norethindrone (AYGESTIN) 5 MG tablet Take 5 mg by mouth daily. 11/12/16   [provider]    Family History Family History  Problem Relation Age of Onset  . Hypercholesterolemia Mother   . Diabetes Mellitus II Father   . Hypercholesterolemia Father   . Hypertension Father     Social History Social History   Tobacco Use  . Smoking status: Former Smoker    Years: 4.00    Types: Cigarettes    Last attempt to quit: 08/15/2009    Years since quitting: 8.9  . Smokeless tobacco: Never Used  . Tobacco comment: 07/30/2012 "only smoked 3-4 cigarettes on the weekends when I was smoking"  Substance Use Topics  . Alcohol use:  No    Comment: 07/30/2012 "have drank; never frequent; last drink ~ 2001"  . Drug use: No     Allergies   Bactrim [sulfamethoxazole-trimethoprim]; Sulfa antibiotics; and Vancomycin   Review of Systems Review of Systems  Musculoskeletal: Positive for arthralgias.  Skin: Negative for wound.  Neurological: Negative for weakness and numbness.  All other systems reviewed and are negative.    Physical Exam Updated Vital Signs BP 138/78   Pulse 99   Temp 98.1 F (36.7 C) (Oral)   Resp 18   Ht 5\' 3"  (1.6 m)   Wt (!) 224.5 kg   SpO2 100%   BMI 87.69 kg/m   Physical Exam  Constitutional: She  appears well-developed and well-nourished.  HENT:  Head: Normocephalic and atraumatic.  Right Ear: External ear normal.  Left Ear: External ear normal.  Eyes: Conjunctivae are normal. Right eye exhibits no discharge. Left eye exhibits no discharge. No scleral icterus.  Cardiovascular:  Pulses:      Dorsalis pedis pulses are 2+ on the left side.       Posterior tibial pulses are 2+ on the left side.  Pulmonary/Chest: Effort normal. No respiratory distress.  Musculoskeletal:  Exam extremely limited secondary to body habitus.  Patient with diffuse tenderness of the left knee.  No tenderness of the left hip or left ankle.  She is able to demonstrate mild extension of the knee against gravity.  Compartments are soft.  Neurological: She is alert. She has normal strength. No sensory deficit.  Skin: Skin is warm and dry. Capillary refill takes less than 2 seconds. No abrasion and no laceration noted. No erythema. No pallor.  Psychiatric: She has a normal mood and affect.  Nursing note and vitals reviewed.    ED Treatments / Results  Labs (all labs ordered are listed, but only abnormal results are displayed) Labs Reviewed - No data to display  EKG None  Radiology Dg Knee Complete 4 Views Left  Result Date: 07/21/2018 CLINICAL DATA:  Status post fall this morning with diffuse left knee pain. EXAM: LEFT KNEE - COMPLETE 4+ VIEW COMPARISON:  None. FINDINGS: Degenerative joint changes with osteophytosis and narrowed joint spaces are noted in the left knee. There is discontinuity in osteophyte extending off of the medial distal femur which may represent fracture through the osteophyte. There is no dislocation. IMPRESSION: There is discontinuity in osteophyte extending off of the medial distal femur which may represent fracture through the osteophyte. 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 Electronically Signed   By: Sherian Rein M.D.   On: 07/21/2018 10:49    Procedures Procedures  (including critical care time)  Medications Ordered in ED Medications  HYDROcodone-acetaminophen (NORCO/VICODIN) 5-325 MG per tablet 1 tablet (has no administration in time range)     Initial Impression / Assessment and Plan / ED Course  I have reviewed the triage vital signs and the nursing notes.  Pertinent labs & imaging results that were available during my care of the patient were reviewed by me and considered in my medical decision making (see chart for details).     42 y.o. female with left knee pain after fall.  There is no evidence of quadriceps rupture on exam.  She is neurovascular intact.  X-ray with possible fracture of the medial distal femur. This is a closed fracture.  No concern for tibial plateau fracture.  Pain controlled in the department.  Patient given knee Ace wrap as well as bariatric crutches.  Case management  was consulted and wheelchair was ordered for home.  She is to follow-up with orthopedics.  Will discharge home with pain medication. Patient reviewed in [place] Controlled Substance Reporting System without any discrepancies found. I advised the patient to follow-up with orthopedics this week. She is to call today. Specific return precautions discussed. Time was given for all questions to be answered. The patient verbalized understanding and agreement with plan. The patient appears safe for discharge home.  Final Clinical Impressions(s) / ED Diagnoses   Final diagnoses:  Acute pain of left knee  Abnormal x-ray    ED Discharge Orders         Ordered    HYDROcodone-acetaminophen (NORCO/VICODIN) 5-325 MG tablet  Every 6 hours PRN     07/21/18 1218     07/21/18 1244    Wheelchair     07/21/18 1407           Princella Pellegrini 07/23/18 0047    Mancel Bale, MD 07/23/18 1008

## 2018-07-21 NOTE — Care Management Note (Signed)
Case Management Note  CM consulted for wheelchair DME.  Note pt's weight is above the size wheelchair that [facility] carries.  CM [facility] for other locations pt could pick up wheelchair. [facility] Clydie BraunKaren with [facility] provided Size [employer] out of [place] [employer].  Pt provided phone number on AVS.  Discussed information with pt at bedside.  Updated Kathlene NovemberMike, RN and [place], [place].  No further CM needs noted at this time.  Meshia Rau, Lynnae SandhoffAngela N, RN 07/21/2018, 2:50 PM

## 2018-09-20 ENCOUNTER — Other Ambulatory Visit (HOSPITAL_COMMUNITY): Payer: Self-pay | Admitting: Specialist

## 2018-09-20 ENCOUNTER — Other Ambulatory Visit: Payer: Self-pay | Admitting: Specialist

## 2018-09-20 DIAGNOSIS — M25562 Pain in left knee: Secondary | ICD-10-CM

## 2020-05-08 ENCOUNTER — Emergency Department (HOSPITAL_COMMUNITY)
Admission: EM | Admit: 2020-05-08 | Discharge: 2020-05-08 | Disposition: A | Discharge status: Home / Self Care | Payer: Managed Care, Other (non HMO) | Attending: Emergency Medicine | Admitting: Emergency Medicine

## 2020-05-08 ENCOUNTER — Emergency Department (HOSPITAL_COMMUNITY): Payer: Managed Care, Other (non HMO)

## 2020-05-08 ENCOUNTER — Encounter (HOSPITAL_COMMUNITY): Payer: Self-pay | Admitting: Emergency Medicine

## 2020-05-08 ENCOUNTER — Other Ambulatory Visit: Payer: Self-pay

## 2020-05-08 DIAGNOSIS — R1032 Left lower quadrant pain: Secondary | ICD-10-CM | POA: Insufficient documentation

## 2020-05-08 DIAGNOSIS — R109 Unspecified abdominal pain: Secondary | ICD-10-CM

## 2020-05-08 HISTORY — DX: Lymphedema, not elsewhere classified: I89.0

## 2020-05-08 HISTORY — DX: Candidiasis, unspecified: B37.9

## 2020-05-08 LAB — I-STAT BETA HCG BLOOD, ED (MC, WL, AP ONLY): I-stat hCG, quantitative: <5 m[IU]/mL (ref <5)

## 2020-05-08 LAB — CBC
HCT: 45.5 % (ref 36.0–46.0)
Hemoglobin: 14.5 g/dL (ref 12.0–15.0)
MCH: 29.7 pg (ref 26.0–34.0)
MCHC: 31.9 g/dL (ref 30.0–36.0)
MCV: 93 fL (ref 80.0–100.0)
Platelets: 225 10*3/uL (ref 150–400)
RBC: 4.89 MIL/uL (ref 3.87–5.11)
RDW: 13.7 % (ref 11.5–15.5)
WBC: 4.8 10*3/uL (ref 4.0–10.5)
nRBC: 0 % (ref 0.0–0.2)

## 2020-05-08 LAB — URINALYSIS, ROUTINE W REFLEX MICROSCOPIC
Bilirubin Urine: NEGATIVE
Glucose, UA: NEGATIVE mg/dL
Hgb urine dipstick: NEGATIVE
Ketones, ur: NEGATIVE mg/dL
Leukocytes,Ua: NEGATIVE
Nitrite: NEGATIVE
Protein, ur: NEGATIVE mg/dL
Specific Gravity, Urine: 1.018 (ref 1.005–1.030)
pH: 6 (ref 5.0–8.0)

## 2020-05-08 LAB — LIPASE, BLOOD: Lipase: 21 U/L (ref 11–51)

## 2020-05-08 LAB — COMPREHENSIVE METABOLIC PANEL
ALT: 30 U/L (ref 0–44)
AST: 32 U/L (ref 15–41)
Albumin: 3.9 g/dL (ref 3.5–5.0)
Alkaline Phosphatase: 48 U/L (ref 38–126)
Anion gap: 12 (ref 5–15)
BUN: 10 mg/dL (ref 6–20)
CO2: 25 mmol/L (ref 22–32)
Calcium: 9.4 mg/dL (ref 8.9–10.3)
Chloride: 103 mmol/L (ref 98–111)
Creatinine, Ser: 0.56 mg/dL (ref 0.44–1.00)
GFR calc Af Amer: >60 mL/min (ref >60)
GFR calc non Af Amer: >60 mL/min (ref >60)
Glucose, Bld: 115 mg/dL — ABNORMAL HIGH (ref 70–99)
Potassium: 4.1 mmol/L (ref 3.5–5.1)
Sodium: 140 mmol/L (ref 135–145)
Total Bilirubin: 1.2 mg/dL (ref 0.3–1.2)
Total Protein: 7.9 g/dL (ref 6.5–8.1)

## 2020-05-08 MED ORDER — IOHEXOL 300 MG/ML  SOLN
100.0000 mL | Freq: Once | INTRAMUSCULAR | Status: AC | PRN
  Administered 2020-05-08: 100 mL via INTRAVENOUS

## 2020-05-08 MED ORDER — POLYETHYLENE GLYCOL 3350 17 G PO PACK: 17.0000 g | PACK | Freq: Every day | ORAL | 0 refills | Status: AC

## 2020-05-08 MED ORDER — SODIUM CHLORIDE 0.9% FLUSH
3.0000 mL | Freq: Once | INTRAVENOUS | Status: AC
  Administered 2020-05-08: 3 mL via INTRAVENOUS

## 2020-05-08 MED ORDER — SODIUM CHLORIDE 0.9 % IV BOLUS
500.0000 mL | Freq: Once | INTRAVENOUS | Status: AC
  Administered 2020-05-08: 500 mL via INTRAVENOUS

## 2020-05-08 NOTE — ED Notes (Signed)
Verbalized understanding of DC instructions, Rx, follow up care 

## 2020-05-08 NOTE — ED Provider Notes (Signed)
[facility] EMERGENCY DEPARTMENT Provider Note   CSN: [id number] Arrival date & time: 05/08/20  0944     History Chief Complaint  Patient presents with  . Abdominal Pain    Amber Kline is a 44 y.o. female.  HPI   Patient is a 44 year old female with a history of cellulitis, edema, OSA on BiPAP, who presents to the emergency department today for evaluation of abdominal pain.  States that 2 days ago she developed left lower quadrant abdominal pain.  States the pain was severe for about 30 minutes but seem to get better.  Since then she has continued to have pain in the left lower quadrant that has not been as severe.  She reports associated nausea and some constipation.  She denies any vomiting, diarrhea, bloody stools or fevers.  Denies any urinary symptoms.  States she was concerned that she may have a hernia.  Past Medical History:  Diagnosis Date  . Cellulitis ~ 2005; 03/2011; 07/2011; 07/30/12   LLE: LLE: RLE/pt; RLE "both times I have been hospitalized it's been for the right leg"  . Dysrhythmia 07/30/2012   tachycardia  . Lymphedema   . On home O2 07/30/2012   "sleep w/2L @ night"  . OSA (obstructive sleep apnea)    "severe; sleep w/oxygen and BiPAP"  . PONV (postoperative nausea and vomiting)   . Yeast infection     Patient Active Problem List   Diagnosis Date Noted  . 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 Sepsis (HCC) 03/04/2015  . Obstructive sleep apnea 07/31/2012  . Lymphedema 07/31/2012  . Protein calorie malnutrition (HCC) 07/31/2012  . Microcytic anemia 07/31/2012  . Hematuria 07/31/2012  . Morbid obesity (HCC) 07/31/2012  . Irregular menses 07/31/2012    Past Surgical History:  Procedure Laterality Date  . DILATATION & CURRETTAGE/HYSTEROSCOPY WITH RESECTOCOPE N/A 11/15/2015   Procedure: DILATATION & CURETTAGE/HYSTEROSCOPY WITH RESECTION OF ENDOMETRIAL POLYPS;  Surgeon: Silverio Lay, MD;  Location: [facility];   Service: Gynecology;  Laterality: N/A;  . DILATION AND CURETTAGE OF UTERUS    . HYSTEROSCOPY WITH D & C  2006   for uterine polyp     OB History   No obstetric history on file.     Family History  Problem Relation Age of Onset  . Hypercholesterolemia Mother   . Diabetes Mellitus II Father   . Hypercholesterolemia Father   . Hypertension Father     Social History   Tobacco Use  . Smoking status: Former Smoker    Years: 4.00    Types: Cigarettes    Quit date: 08/15/2009    Years since quitting: 10.7  . Smokeless tobacco: Never Used  . Tobacco comment: 07/30/2012 "only smoked 3-4 cigarettes on the weekends when I was smoking"  Substance Use Topics  . Alcohol use: No    Comment: 07/30/2012 "have drank; never frequent; last drink ~ 2001"  . Drug use: No    Home Medications Prior to Admission medications   Medication Sig Start Date End Date Taking? Authorizing Provider  acetaminophen (TYLENOL) 500 MG tablet Take 1,500 mg by mouth daily as needed for moderate pain.   Yes [provider]  cephALEXin (KEFLEX) 500 MG capsule Take 500 mg by mouth 2 (two) times daily. 06/26/18  Yes [provider]  fluconazole (DIFLUCAN) 200 MG tablet Take 200 mg by mouth See admin instructions. As needed, about 7 days per month for yeast infection. 03/03/20  Yes [provider]  norethindrone (AYGESTIN) 5 MG tablet Take 5 mg by mouth See admin instructions. Only on days 1-13 of every month 11/12/16  Yes [provider]  HYDROcodone-acetaminophen (NORCO/VICODIN) 5-325 MG tablet Take 1 tablet by mouth every 6 (six) hours as needed. Patient not taking: Reported on 05/08/2020 07/21/18   Maczis, Elmer Sow, PA-C  ibuprofen (ADVIL,MOTRIN) 600 MG tablet Take 1 tablet (600 mg total) by mouth every 6 (six) hours as needed for fever or headache. Patient not taking: Reported on 07/21/2018 01/21/17   Vassie Loll, MD  linezolid (ZYVOX) 600 MG tablet Take 1 tablet (600 mg total) by mouth  2 (two) times daily. Patient not taking: Reported on 07/21/2018 01/21/17   Vassie Loll, MD  polyethylene glycol (MIRALAX) 17 g packet Take 17 g by mouth daily. Dissolve one cap full in solution (water, gatorade, etc.) and administer once cap-full daily. You may titrate up daily by 1 cap-full until the patient is having pudding consistency of stools. After the patient is able to start passing softer stools they will need to be on 1/2 cap-full daily for 2 weeks. 05/08/20   Fayette Hamada S, PA-C    Allergies    Bactrim [sulfamethoxazole-trimethoprim], Sulfa antibiotics, and Vancomycin  Review of Systems   Review of Systems  Constitutional: Negative for fever.  HENT: Negative for ear pain and sore throat.   Eyes: Negative for visual disturbance.  Respiratory: Negative for cough and shortness of breath.   Cardiovascular: Negative for chest pain.  Gastrointestinal: Positive for abdominal pain, constipation and nausea. Negative for diarrhea and vomiting.  Genitourinary: Negative for dysuria and hematuria.  Musculoskeletal: Negative for back pain.  Skin: Negative for rash.  Neurological: Negative for headaches.  All other systems reviewed and are negative.   Physical Exam Updated Vital Signs BP 113/83 (BP Location: Right Arm)   Pulse (!) 101   Temp 98.4 F (36.9 C) (Oral)   Resp (!) 24   SpO2 100%   Physical Exam Vitals and nursing note reviewed.  Constitutional:      General: She is not in acute distress.    Appearance: She is well-developed.  HENT:     Head: Normocephalic and atraumatic.  Eyes:     Conjunctiva/sclera: Conjunctivae normal.  Cardiovascular:     Rate and Rhythm: Normal rate and regular rhythm.     Heart sounds: Normal heart sounds. No murmur.  Pulmonary:     Effort: Pulmonary effort is normal. No respiratory distress.     Breath sounds: Normal breath sounds. No wheezing, rhonchi or rales.  Abdominal:     General: Abdomen is protuberant. Bowel sounds are normal.      Palpations: Abdomen is soft.     Tenderness: There is abdominal tenderness in the left lower quadrant. There is no guarding or rebound.     Comments: 1x2cm area of firmer tissue to the abd wall, there is no erythema, fluctuance or induration to suggest infection.   Musculoskeletal:     Cervical back: Neck supple.  Skin:    General: Skin is warm and dry.  Neurological:     Mental Status: She is alert.     ED Results / Procedures / Treatments   Labs (all labs ordered are listed, but only abnormal results are displayed) Labs Reviewed  COMPREHENSIVE METABOLIC PANEL - Abnormal; Notable for the following components:      Result Value   Glucose, Bld 115 (*)    All other components within normal limits  LIPASE, BLOOD  CBC  URINALYSIS, ROUTINE W REFLEX MICROSCOPIC  I-STAT BETA HCG BLOOD, ED ([facility], [facility], [facility] ONLY)    EKG None  Radiology CT ABDOMEN PELVIS W CONTRAST  Result Date: 05/08/2020 CLINICAL DATA:  Left lower quadrant pain. EXAM: CT ABDOMEN AND PELVIS WITH CONTRAST TECHNIQUE: Multidetector CT imaging of the abdomen and pelvis was performed using the standard protocol following bolus administration of intravenous contrast. CONTRAST:  OMNIPAQUE IOHEXOL 300 MG/ML  SOLN COMPARISON:  None. FINDINGS: Lower chest: No acute abnormality. Hepatobiliary: No focal liver abnormality is seen. No gallstones, gallbladder wall thickening, or biliary dilatation. Pancreas: Unremarkable. No pancreatic ductal dilatation or surrounding inflammatory changes. Spleen: Normal in size without focal abnormality. Adrenals/Urinary Tract: Adrenal glands are unremarkable. Kidneys are normal, without renal calculi, focal lesion, or hydronephrosis. Bladder is unremarkable. Stomach/Bowel: Stomach is within normal limits. Appendix appears normal. No evidence of bowel wall thickening, distention, or inflammatory changes. Vascular/Lymphatic: No significant vascular findings are present. No enlarged abdominal or pelvic  lymph nodes. Reproductive: Uterus and bilateral adnexa are unremarkable. Other: No abdominal wall hernia or abnormality. No abdominopelvic ascites. It should be noted that the posterior aspect of the lower abdomen and pelvis is limited in evaluation secondary to overlying beam hardening artifact. Musculoskeletal: No acute or significant osseous findings. IMPRESSION: 1. No acute process in the abdomen or pelvis. 2. Limited evaluation of the posterior aspect of the lower abdomen and pelvis secondary to overlying beam hardening artifact. Electronically Signed   By: Aram Candela M.D.   On: 05/08/2020 19:52    Procedures Procedures (including critical care time)  Medications Ordered in ED Medications  sodium chloride flush (NS) 0.9 % injection 3 mL (3 mLs Intravenous Given 05/08/20 1707)  sodium chloride 0.9 % bolus 500 mL (0 mLs Intravenous Stopped 05/08/20 1919)  iohexol (OMNIPAQUE) 300 MG/ML solution 100 mL (100 mLs Intravenous Contrast Given 05/08/20 1945)    ED Course  I have reviewed the triage vital signs and the nursing notes.  Pertinent labs & imaging results that were available during my care of the patient were reviewed by me and considered in my medical decision making (see chart for details).    MDM Rules/Calculators/A&P                      44 year old female presenting for evaluation of left lower quadrant abdominal pain starting 2 days ago.  Associated with some nausea and constipation.  Denies any other associated symptoms.  Patient afebrile on arrival.  She is borderline tachycardic which on chart review appears chronic for her.  Vital signs are otherwise reassuring.  She does have some normal left lower quadrant abdominal tenderness without guarding or rebound.  She declined pain meds on initial eval. Patient is nontoxic, nonseptic appearing, in no apparent distress. Fluid bolus given.  Labs, imaging and vitals reviewed. Cbc w/o leukocytosis or anemia. CMP with normal  electrolytes and kidney function.  Normal liver function.  Lipase negative.  Beta-hCG negative.  UA without signs of infection.  Patient does not meet the SIRS or Sepsis criteria.  On repeat exam patient does not have a surgical abdomin and there are no peritoneal signs.  No indication of appendicitis, bowel obstruction, bowel perforation, cholecystitis, diverticulitis, or ectopic pregnancy.  Patient discharged home with symptomatic treatment and given strict instructions for follow-up with their primary care physician.  I have also discussed reasons to return immediately to the ER.  Patient expresses understanding and agrees with plan.  Final Clinical Impression(s) /  ED Diagnoses Final diagnoses:  Abdominal pain, unspecified abdominal location    Rx / DC Orders ED Discharge Orders         Ordered    polyethylene glycol (MIRALAX) 17 g packet  Daily     05/08/20 2034           Rodney Booze, Hershal Coria 05/08/20 2034    Gareth Morgan, MD 05/09/20 1031

## 2020-05-08 NOTE — ED Triage Notes (Signed)
C/o LLQ pain since Saturday.  States she feels a knot there.  Reports watery BM Sunday morning and constipation since then.  Mild nausea.

## 2020-05-08 NOTE — Discharge Instructions (Addendum)

## 2020-05-08 NOTE — ED Notes (Signed)
Off floor to CT 

## 2021-03-07 ENCOUNTER — Encounter (HOSPITAL_COMMUNITY): Payer: Self-pay

## 2021-03-07 ENCOUNTER — Emergency Department (HOSPITAL_COMMUNITY)
Admission: EM | Admit: 2021-03-07 | Discharge: 2021-03-07 | Disposition: A | Discharge status: Home / Self Care | Payer: Managed Care, Other (non HMO) | Attending: Emergency Medicine | Admitting: Emergency Medicine

## 2021-03-07 ENCOUNTER — Emergency Department (HOSPITAL_COMMUNITY): Payer: Managed Care, Other (non HMO)

## 2021-03-07 ENCOUNTER — Other Ambulatory Visit: Payer: Self-pay

## 2021-03-07 DIAGNOSIS — R1032 Left lower quadrant pain: Secondary | ICD-10-CM | POA: Diagnosis present

## 2021-03-07 DIAGNOSIS — R300 Dysuria: Secondary | ICD-10-CM | POA: Diagnosis not present

## 2021-03-07 DIAGNOSIS — Z87891 Personal history of nicotine dependence: Secondary | ICD-10-CM | POA: Diagnosis not present

## 2021-03-07 DIAGNOSIS — R1030 Lower abdominal pain, unspecified: Secondary | ICD-10-CM

## 2021-03-07 LAB — CBC WITH DIFFERENTIAL/PLATELET
Abs Immature Granulocytes: 0.05 10*3/uL (ref 0.00–0.07)
Basophils Absolute: 0 10*3/uL (ref 0.0–0.1)
Basophils Relative: 0 %
Eosinophils Absolute: 0.1 10*3/uL (ref 0.0–0.5)
Eosinophils Relative: 1 %
HCT: 41.9 % (ref 36.0–46.0)
Hemoglobin: 13.5 g/dL (ref 12.0–15.0)
Immature Granulocytes: 1 %
Lymphocytes Relative: 21 %
Lymphs Abs: 1 10*3/uL (ref 0.7–4.0)
MCH: 29 pg (ref 26.0–34.0)
MCHC: 32.2 g/dL (ref 30.0–36.0)
MCV: 89.9 fL (ref 80.0–100.0)
Monocytes Absolute: 0.4 10*3/uL (ref 0.1–1.0)
Monocytes Relative: 8 %
Neutro Abs: 3.3 10*3/uL (ref 1.7–7.7)
Neutrophils Relative %: 69 %
Platelets: 246 10*3/uL (ref 150–400)
RBC: 4.66 MIL/uL (ref 3.87–5.11)
RDW: 15.5 % (ref 11.5–15.5)
WBC: 4.9 10*3/uL (ref 4.0–10.5)
nRBC: 0 % (ref 0.0–0.2)

## 2021-03-07 LAB — URINALYSIS, ROUTINE W REFLEX MICROSCOPIC
Bilirubin Urine: NEGATIVE
Glucose, UA: NEGATIVE mg/dL
Ketones, ur: NEGATIVE mg/dL
Leukocytes,Ua: NEGATIVE
Nitrite: NEGATIVE
Protein, ur: 30 mg/dL — AB
RBC / HPF: >50 RBC/hpf — ABNORMAL HIGH (ref 0–5)
Specific Gravity, Urine: 1.005 (ref 1.005–1.030)
pH: 6 (ref 5.0–8.0)

## 2021-03-07 LAB — BASIC METABOLIC PANEL
Anion gap: 8 (ref 5–15)
BUN: 9 mg/dL (ref 6–20)
CO2: 24 mmol/L (ref 22–32)
Calcium: 8.8 mg/dL — ABNORMAL LOW (ref 8.9–10.3)
Chloride: 107 mmol/L (ref 98–111)
Creatinine, Ser: 0.56 mg/dL (ref 0.44–1.00)
GFR, Estimated: >60 mL/min (ref >60)
Glucose, Bld: 106 mg/dL — ABNORMAL HIGH (ref 70–99)
Potassium: 4.1 mmol/L (ref 3.5–5.1)
Sodium: 139 mmol/L (ref 135–145)

## 2021-03-07 LAB — POC URINE PREG, ED: Preg Test, Ur: NEGATIVE

## 2021-03-07 MED ORDER — ACETAMINOPHEN 325 MG PO TABS
650.0000 mg | ORAL_TABLET | Freq: Once | ORAL | Status: AC
  Administered 2021-03-07: 650 mg via ORAL
  Filled 2021-03-07: qty 2

## 2021-03-07 NOTE — Discharge Instructions (Addendum)
Your lab work and ultrasound today does not show any source for your pain.  If pain continues, recommend follow-up with your doctor.  Return to the emergency room for any worsening or concerning symptoms. Continue with Tylenol as needed as directed.

## 2021-03-07 NOTE — ED Triage Notes (Signed)
Lower abdominal pain, dysuria for one week worse the last days

## 2021-03-07 NOTE — ED Provider Notes (Signed)
[facility]-EMERGENCY DEPT Provider Note   CSN: [id number] Arrival date & time: 03/07/21  [id number]     History Chief Complaint  Patient presents with  . Abdominal Pain  . Dysuria    Amber Kline is a 45 y.o. female.  45 year old female with history of OSA (sleeps with 2 L and BiPAP), lymphedema, presents with complaint of lower abdominal pain for the past few days, constant, worse with emptying her bladder, improves after bladder is emptied with frequency.  Denies changes in bowel habits, nausea, vomiting, fevers, chills.  Patient is currently on her menstrual cycle, states this is different than her typical menstrual pain.  Takes Keflex prophylactically for cellulitis.  Up-to-date on annual exam, states that she gets a pelvic ultrasound yearly which has been normal.        Past Medical History:  Diagnosis Date  . Cellulitis ~ 2005; 03/2011; 07/2011; 07/30/12   LLE: LLE: RLE/pt; RLE "both times I have been hospitalized it's been for the right leg"  . Dysrhythmia 07/30/2012   tachycardia  . Lymphedema   . On home O2 07/30/2012   "sleep w/2L @ night"  . OSA (obstructive sleep apnea)    "severe; sleep w/oxygen and BiPAP"  . PONV (postoperative nausea and vomiting)   . Yeast infection     Patient Active Problem List   Diagnosis Date Noted  . 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 Sepsis (HCC) 03/04/2015  . Obstructive sleep apnea 07/31/2012  . Lymphedema 07/31/2012  . Protein calorie malnutrition (HCC) 07/31/2012  . Microcytic anemia 07/31/2012  . Hematuria 07/31/2012  . Morbid obesity (HCC) 07/31/2012  . Irregular menses 07/31/2012    Past Surgical History:  Procedure Laterality Date  . DILATATION & CURRETTAGE/HYSTEROSCOPY WITH RESECTOCOPE N/A 11/15/2015   Procedure: DILATATION & CURETTAGE/HYSTEROSCOPY WITH RESECTION OF ENDOMETRIAL POLYPS;  Surgeon: Silverio LaySandra Rivard, MD;  Location: [facility];  Service: Gynecology;  Laterality: N/A;  .  DILATION AND CURETTAGE OF UTERUS    . HYSTEROSCOPY WITH D & C  2006   for uterine polyp     OB History   No obstetric history on file.     Family History  Problem Relation Age of Onset  . Hypercholesterolemia Mother   . Diabetes Mellitus II Father   . Hypercholesterolemia Father   . Hypertension Father     Social History   Tobacco Use  . Smoking status: Former Smoker    Years: 4.00    Types: Cigarettes    Quit date: 08/15/2009    Years since quitting: 11.5  . Smokeless tobacco: Never Used  . Tobacco comment: 07/30/2012 "only smoked 3-4 cigarettes on the weekends when I was smoking"  Substance Use Topics  . Alcohol use: No    Comment: 07/30/2012 "have drank; never frequent; last drink ~ 2001"  . Drug use: No    Home Medications Prior to Admission medications   Medication Sig Start Date End Date Taking? Authorizing Provider  acetaminophen (TYLENOL) 500 MG tablet Take 1,500 mg by mouth daily as needed for moderate pain.    [provider]  cephALEXin (KEFLEX) 500 MG capsule Take 500 mg by mouth 2 (two) times daily. 06/26/18   [provider]  fluconazole (DIFLUCAN) 200 MG tablet Take 200 mg by mouth See admin instructions. As needed, about 7 days per month for yeast infection. 03/03/20   [provider]  HYDROcodone-acetaminophen (NORCO/VICODIN) 5-325 MG tablet Take 1 tablet by mouth every 6 (six) hours as  needed. Patient not taking: Reported on 05/08/2020 07/21/18   Maczis, Elmer Sow, PA-C  ibuprofen (ADVIL,MOTRIN) 600 MG tablet Take 1 tablet (600 mg total) by mouth every 6 (six) hours as needed for fever or headache. Patient not taking: Reported on 07/21/2018 01/21/17   Vassie Loll, MD  linezolid (ZYVOX) 600 MG tablet Take 1 tablet (600 mg total) by mouth 2 (two) times daily. Patient not taking: Reported on 07/21/2018 01/21/17   Vassie Loll, MD  norethindrone (AYGESTIN) 5 MG tablet Take 5 mg by mouth See admin instructions. Only on days 1-13 of every month  11/12/16   [provider]  polyethylene glycol (MIRALAX) 17 g packet Take 17 g by mouth daily. Dissolve one cap full in solution (water, gatorade, etc.) and administer once cap-full daily. You may titrate up daily by 1 cap-full until the patient is having pudding consistency of stools. After the patient is able to start passing softer stools they will need to be on 1/2 cap-full daily for 2 weeks. 05/08/20   Couture, Cortni S, PA-C    Allergies    Bactrim [sulfamethoxazole-trimethoprim], Sulfa antibiotics, and Vancomycin  Review of Systems   Review of Systems  Constitutional: Negative for chills and fever.  Respiratory: Negative for shortness of breath.   Cardiovascular: Negative for chest pain.  Gastrointestinal: Positive for abdominal pain. Negative for constipation, diarrhea, nausea and vomiting.  Genitourinary: Positive for dysuria and frequency. Negative for flank pain.  Musculoskeletal: Negative for back pain.  Skin: Negative for rash and wound.  Allergic/Immunologic: Negative for immunocompromised state.  Neurological: Negative for weakness.  All other systems reviewed and are negative.   Physical Exam Updated Vital Signs BP 122/88   Pulse 88   Temp 98.2 F (36.8 C)   Resp 16   Ht 5\' 3"  (1.6 m)   Wt (!) 231.8 kg   SpO2 100%   BMI 90.52 kg/m   Physical Exam Vitals and nursing note reviewed.  Constitutional:      General: She is not in acute distress.    Appearance: She is well-developed. She is obese. She is not diaphoretic.  HENT:     Head: Normocephalic and atraumatic.  Cardiovascular:     Rate and Rhythm: Normal rate and regular rhythm.     Heart sounds: Normal heart sounds.  Pulmonary:     Effort: Pulmonary effort is normal.     Breath sounds: Normal breath sounds.  Abdominal:     Palpations: Abdomen is soft.     Tenderness: There is abdominal tenderness in the suprapubic area and left lower quadrant. There is no right CVA tenderness or left CVA  tenderness.  Skin:    General: Skin is warm and dry.     Findings: No erythema or rash.  Neurological:     Mental Status: She is alert and oriented to person, place, and time.  Psychiatric:        Behavior: Behavior normal.     ED Results / Procedures / Treatments   Labs (all labs ordered are listed, but only abnormal results are displayed) Labs Reviewed  URINALYSIS, ROUTINE W REFLEX MICROSCOPIC - Abnormal; Notable for the following components:      Result Value   Hgb urine dipstick LARGE (*)    Protein, ur 30 (*)    RBC / HPF >50 (*)    Bacteria, UA RARE (*)    All other components within normal limits  BASIC METABOLIC PANEL - Abnormal; Notable for the following components:  Glucose, Bld 106 (*)    Calcium 8.8 (*)    All other components within normal limits  CBC WITH DIFFERENTIAL/PLATELET  POC URINE PREG, ED    EKG None  Radiology US Transvaginal Non-OB  Result Date: 03/07/2021 CLINICAL DATA:  Pelvic pain history of irregular menses EXAM: TRANSABDOMINAL AND TRANSVAGINAL ULTRASOUND OF PELVIS TECHNIQUE: Both transabdominal and transvaginal ultrasound examinations of the pelvis were performed. Transabdominal technique was performed for global imaging of the pelvis including uterus, ovaries, adnexal regions, and pelvic cul-de-sac. It was necessary to proceed with endovaginal exam following the transabdominal exam to visualize the uterus, endometrium, ovaries and adnexa. COMPARISON:  CT abdomen pelvis May 08, 2020. FINDINGS: Uterus Measurements: 8.5 x 5.4 x 5.0 cm = volume: 119 mL. No fibroids or other mass visualized. Nabothian cyst in the cervix. Endometrium Thickness: 9.6 mm.  No focal abnormality visualized. Right ovary Not visualized and given the presence of ovarian tissue seen on prior CT from 2021 it is likely obscured by bowel gas. Left ovary Not visualized and given the presence of ovarian tissue seen on prior CT from 2021 it is likely obscured by bowel gas. Other findings  No abnormal free fluid. IMPRESSION: 1. Normal sonographic appearance of the uterus and endometrium. 2. Nonvisualization of the ovaries, which are likely obscured by bowel gas. Electronically Signed   By: Maudry Mayhew MD   On: 03/07/2021 13:16   US Pelvis Complete  Result Date: 03/07/2021 CLINICAL DATA:  Pelvic pain history of irregular menses EXAM: TRANSABDOMINAL AND TRANSVAGINAL ULTRASOUND OF PELVIS TECHNIQUE: Both transabdominal and transvaginal ultrasound examinations of the pelvis were performed. Transabdominal technique was performed for global imaging of the pelvis including uterus, ovaries, adnexal regions, and pelvic cul-de-sac. It was necessary to proceed with endovaginal exam following the transabdominal exam to visualize the uterus, endometrium, ovaries and adnexa. COMPARISON:  CT abdomen pelvis May 08, 2020. FINDINGS: Uterus Measurements: 8.5 x 5.4 x 5.0 cm = volume: 119 mL. No fibroids or other mass visualized. Nabothian cyst in the cervix. Endometrium Thickness: 9.6 mm.  No focal abnormality visualized. Right ovary Not visualized and given the presence of ovarian tissue seen on prior CT from 2021 it is likely obscured by bowel gas. Left ovary Not visualized and given the presence of ovarian tissue seen on prior CT from 2021 it is likely obscured by bowel gas. Other findings No abnormal free fluid. IMPRESSION: 1. Normal sonographic appearance of the uterus and endometrium. 2. Nonvisualization of the ovaries, which are likely obscured by bowel gas. Electronically Signed   By: Maudry Mayhew MD   On: 03/07/2021 13:16   [place] Art/Ven Flow Abd Pelv Doppler  Result Date: 03/07/2021 CLINICAL DATA:  Pelvic pain history of irregular menses EXAM: TRANSABDOMINAL AND TRANSVAGINAL ULTRASOUND OF PELVIS TECHNIQUE: Both transabdominal and transvaginal ultrasound examinations of the pelvis were performed. Transabdominal technique was performed for global imaging of the pelvis including uterus, ovaries, adnexal  regions, and pelvic cul-de-sac. It was necessary to proceed with endovaginal exam following the transabdominal exam to visualize the uterus, endometrium, ovaries and adnexa. COMPARISON:  CT abdomen pelvis May 08, 2020. FINDINGS: Uterus Measurements: 8.5 x 5.4 x 5.0 cm = volume: 119 mL. No fibroids or other mass visualized. Nabothian cyst in the cervix. Endometrium Thickness: 9.6 mm.  No focal abnormality visualized. Right ovary Not visualized and given the presence of ovarian tissue seen on prior CT from 2021 it is likely obscured by bowel gas. Left ovary Not visualized and given the presence of ovarian  tissue seen on prior CT from 2021 it is likely obscured by bowel gas. Other findings No abnormal free fluid. IMPRESSION: 1. Normal sonographic appearance of the uterus and endometrium. 2. Nonvisualization of the ovaries, which are likely obscured by bowel gas. Electronically Signed   By: Maudry Mayhew MD   On: 03/07/2021 13:16    Procedures Procedures   Medications Ordered in ED Medications  acetaminophen (TYLENOL) tablet 650 mg (650 mg Oral Given 03/07/21 1109)    ED Course  I have reviewed the triage vital signs and the nursing notes.  Pertinent labs & imaging results that were available during my care of the patient were reviewed by me and considered in my medical decision making (see chart for details).  Clinical Course as of 03/07/21 1408  Thu Mar 07, 2021  [id number] 45 year old female with complaint of suprapubic pain for the past week as above.  On exam has tenderness suprapubic left lower quadrant. CBC within normal limits, BMP unremarkable, hCG negative, urinalysis with blood and urine, no history of stones, no evidence of infection. Discussed results with patient, no significant relief of her pain with Tylenol, declines further pain medication.  Patient elected proceed with pelvic ultrasound to further evaluate her pain, if this is unrevealing, plan is to follow-up with her primary care  provider. [LM]    Clinical Course User Index [LM] Alden Hipp   MDM Rules/Calculators/A&P                          Final Clinical Impression(s) / ED Diagnoses Final diagnoses:  Lower abdominal pain    Rx / DC Orders ED Discharge Orders    None       Jeannie Fend, PA-C 03/07/21 1408    Mancel Bale, MD 03/07/21 [id number]

## 2022-01-01 IMAGING — CT CT ABD-PELV W/ CM
2 of 5 series · 16 of 46 positions shown, 18 images · IV contrast (omnipaque)
Comparison: None.

CLINICAL DATA: Left lower quadrant pain.

EXAM:
CT ABDOMEN AND PELVIS WITH CONTRAST
TECHNIQUE: Multidetector CT imaging of the abdomen and pelvis was performed
using the standard protocol following bolus administration of
intravenous contrast.
CONTRAST:  100mL OMNIPAQUE IOHEXOL 300 MG/ML  SOLN

[Series 3: abd/ pelvis 5.0 i30f 2 · axial · 0.98mm/px · z∈[+811,+1356]mm · 13 of 123 slices shown, 15 images]
[im 7/123  soft-tissue]
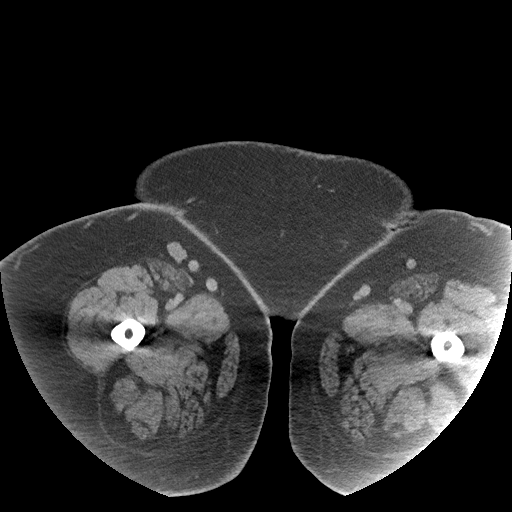
[im 7/123  bone]
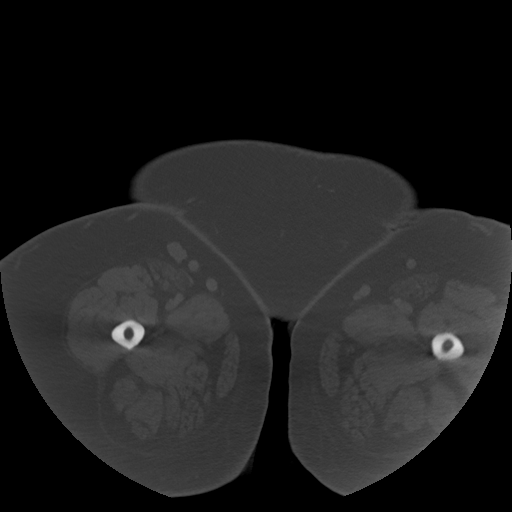
[im 14/123  soft-tissue]
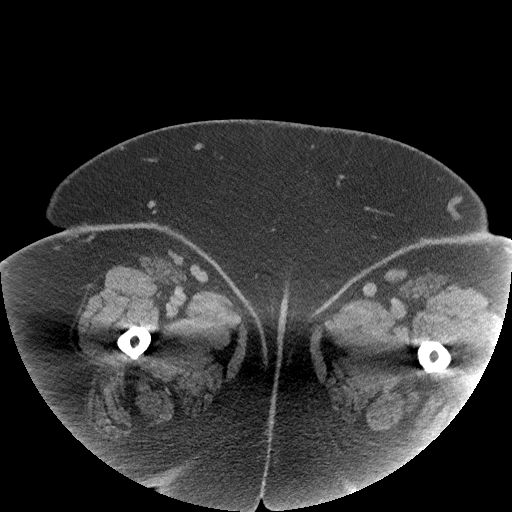
[im 28/123  soft-tissue]
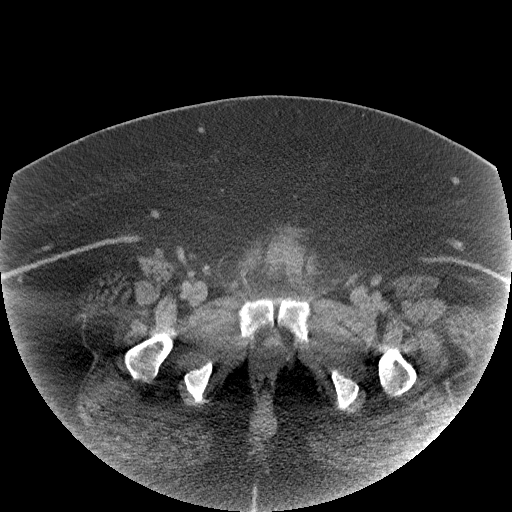
[im 34/123  soft-tissue]
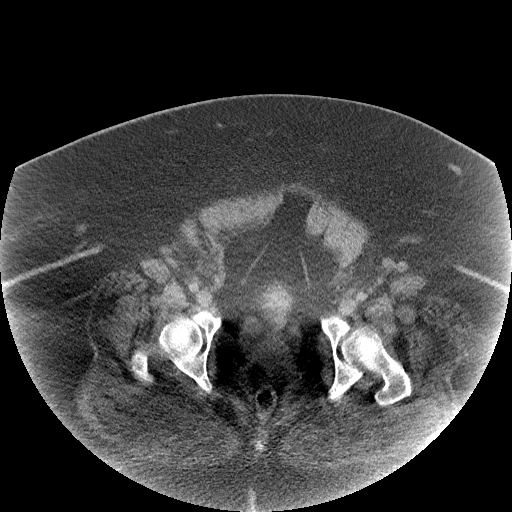
[im 41/123  soft-tissue]
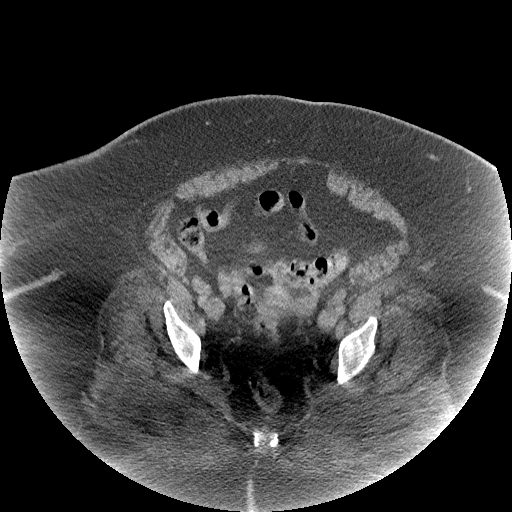
[im 55/123  soft-tissue]
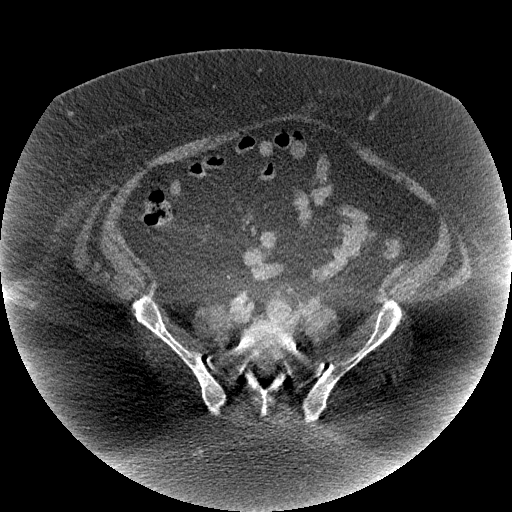
[im 62/123  soft-tissue]
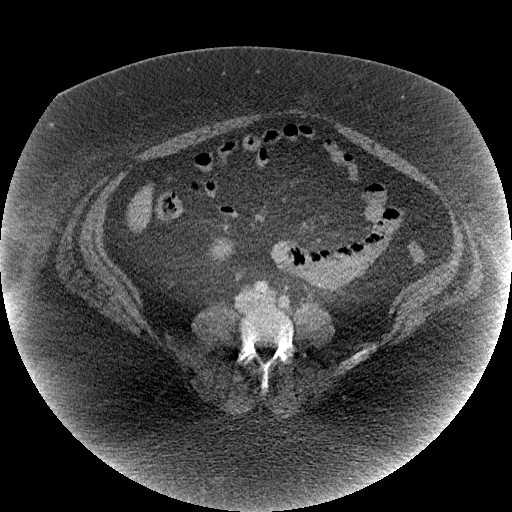
[im 68/123  soft-tissue]
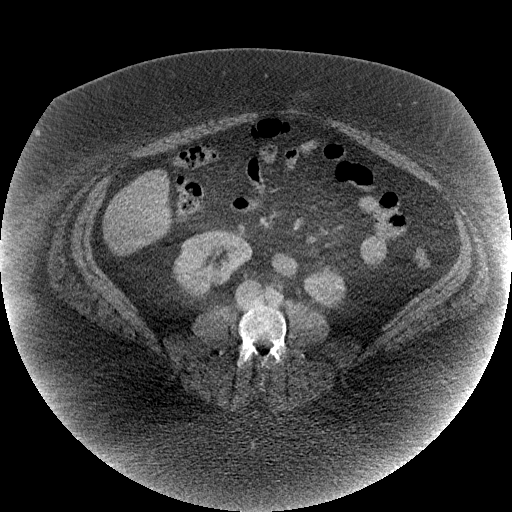
[im 82/123  soft-tissue]
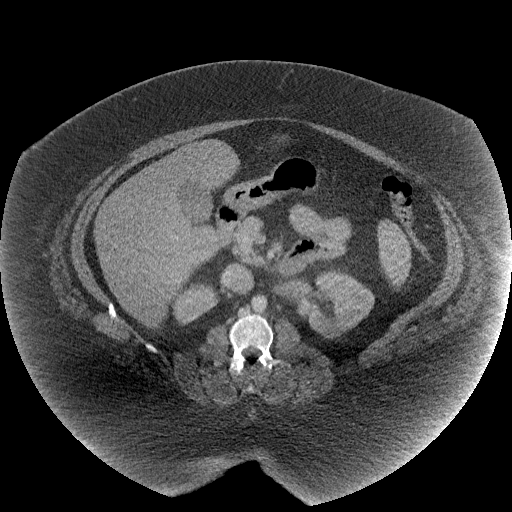
[im 82/123  bone]
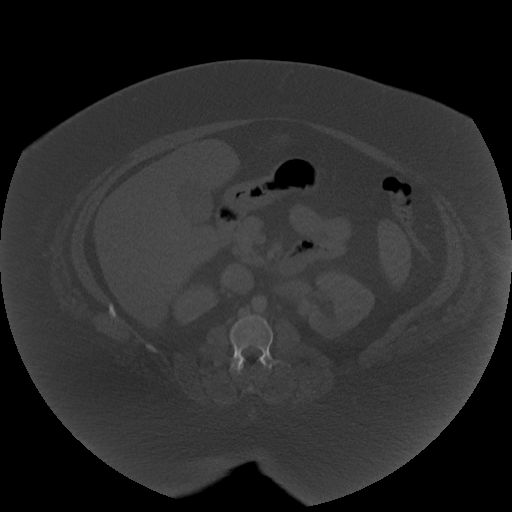
[im 89/123  soft-tissue]
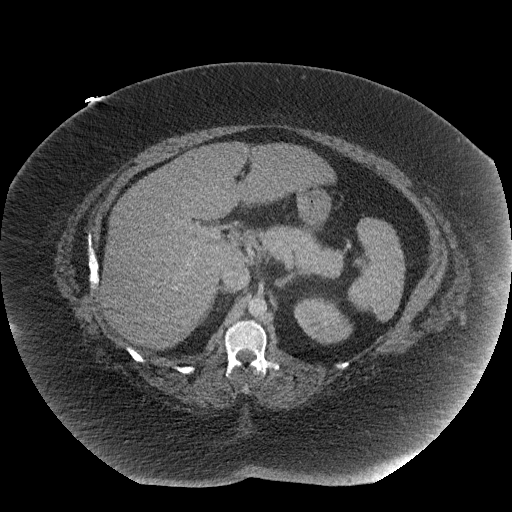
[im 95/123  soft-tissue]
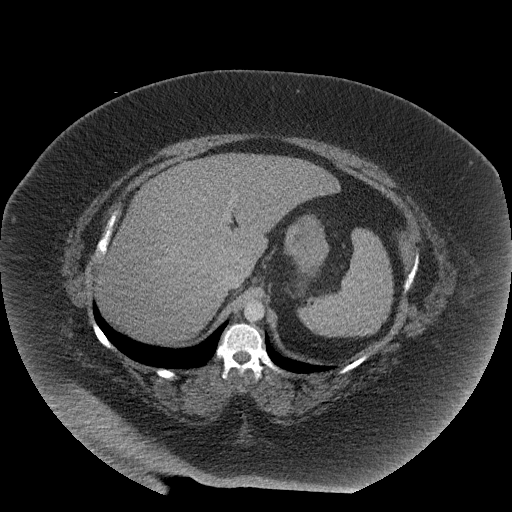
[im 109/123  soft-tissue]
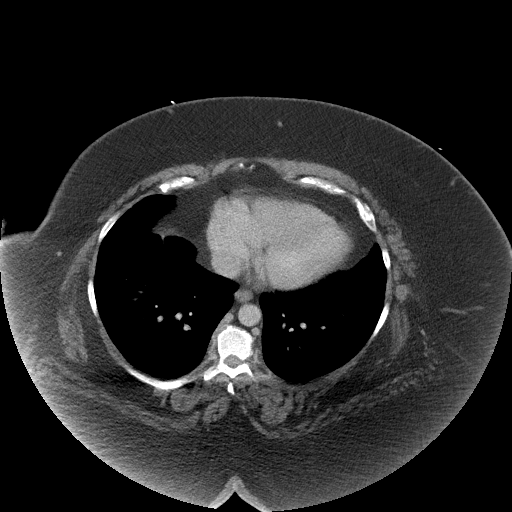
[im 116/123  soft-tissue]
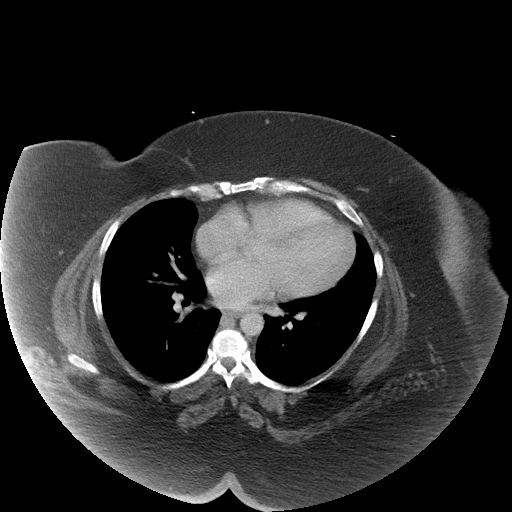

[Series 6: coronal soft tissue · coronal · 1.05mm/px · 3 of 156 slices shown]
[im 52/156  soft-tissue]
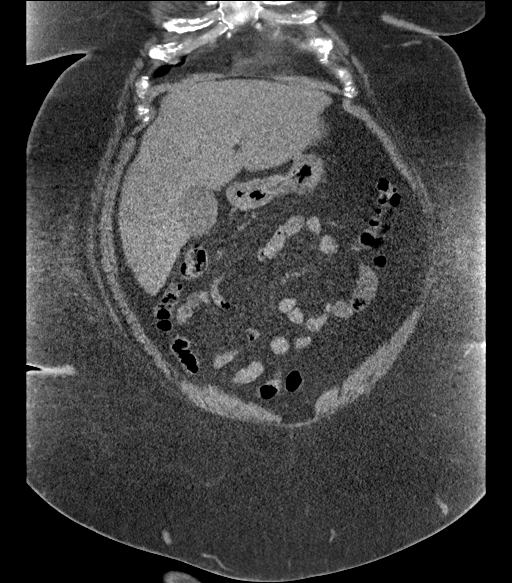
[im 69/156  soft-tissue]
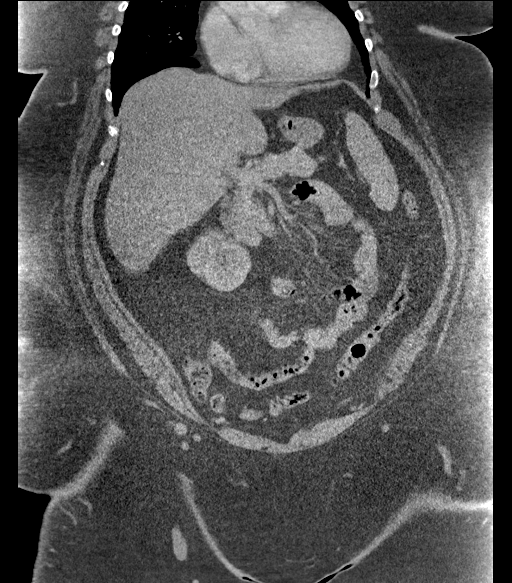
[im 87/156  soft-tissue]
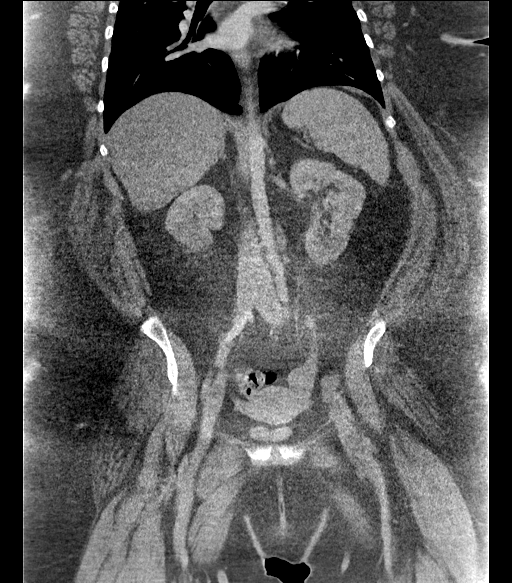

[16 of 46 positions shown; findings below may reference images not displayed]

FINDINGS: Lower chest: No acute abnormality.

Hepatobiliary: No focal liver abnormality is seen. No gallstones,
gallbladder wall thickening, or biliary dilatation.

Pancreas: Unremarkable. No pancreatic ductal dilatation or
surrounding inflammatory changes.

Spleen: Normal in size without focal abnormality.

Adrenals/Urinary Tract: Adrenal glands are unremarkable. Kidneys are
normal, without renal calculi, focal lesion, or hydronephrosis.
Bladder is unremarkable.

Stomach/Bowel: Stomach is within normal limits. Appendix appears
normal. No evidence of bowel wall thickening, distention, or
inflammatory changes.

Vascular/Lymphatic: No significant vascular findings are present. No
enlarged abdominal or pelvic lymph nodes.

Reproductive: Uterus and bilateral adnexa are unremarkable.

Other: No abdominal wall hernia or abnormality. No abdominopelvic
ascites.

It should be noted that the posterior aspect of the lower abdomen
and pelvis is limited in evaluation secondary to overlying beam
hardening artifact.

Musculoskeletal: No acute or significant osseous findings.
IMPRESSION: 1. No acute process in the abdomen or pelvis.
2. Limited evaluation of the posterior aspect of the lower abdomen
and pelvis secondary to overlying beam hardening artifact.

## 2022-10-31 IMAGING — US US ART/VEN ABD/PELV/SCROTUM DOPPLER LTD
1 series · 13 of 25 positions shown · non-contrast
Comparison: CT abdomen pelvis May 08, 2020.

CLINICAL DATA: Pelvic pain history of irregular menses



[Series 1: us art/ven abd/pelv/scrotum doppler ltd · 59 acquisitions, 13 frames shown]
[im 1/59]
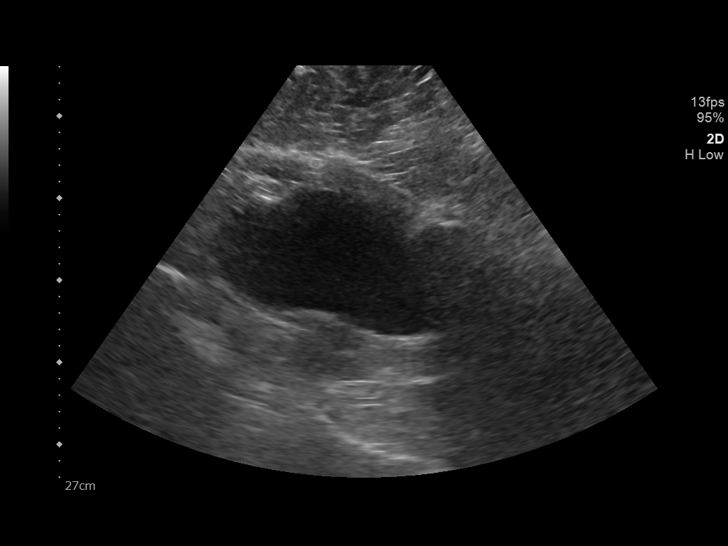
[im 5/59]
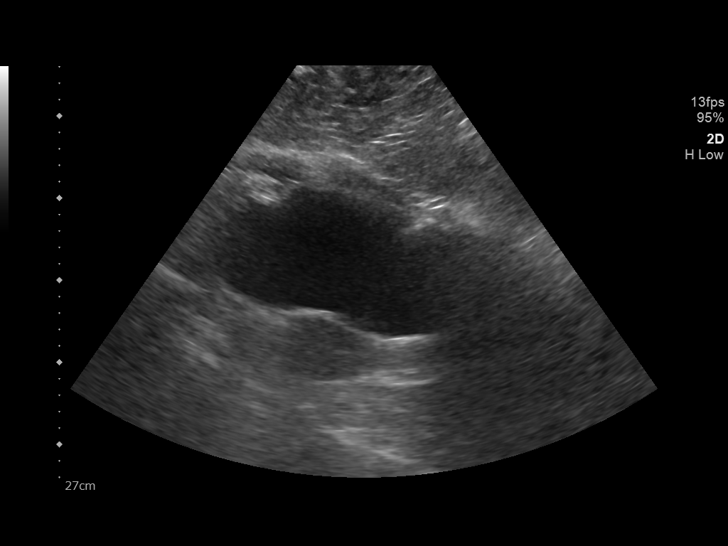
[im 10/59]
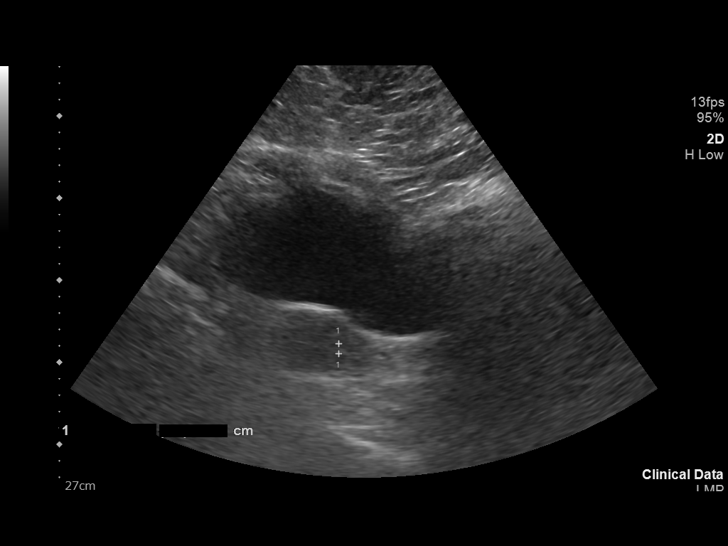
[im 15/59]
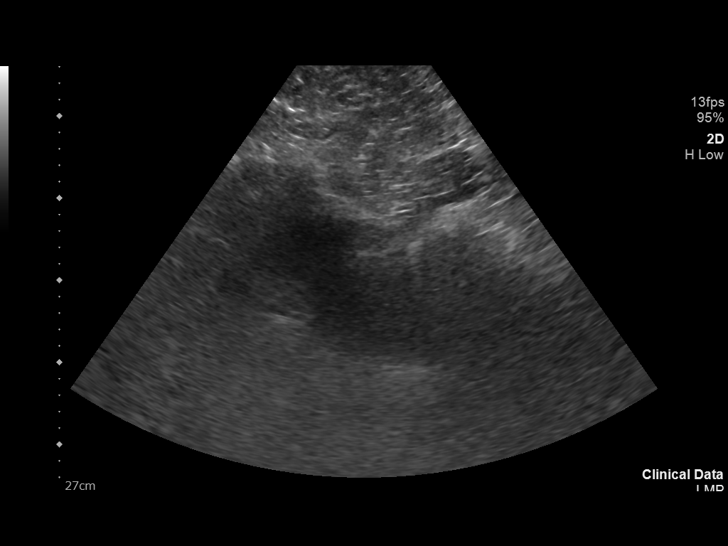
[im 20/59]
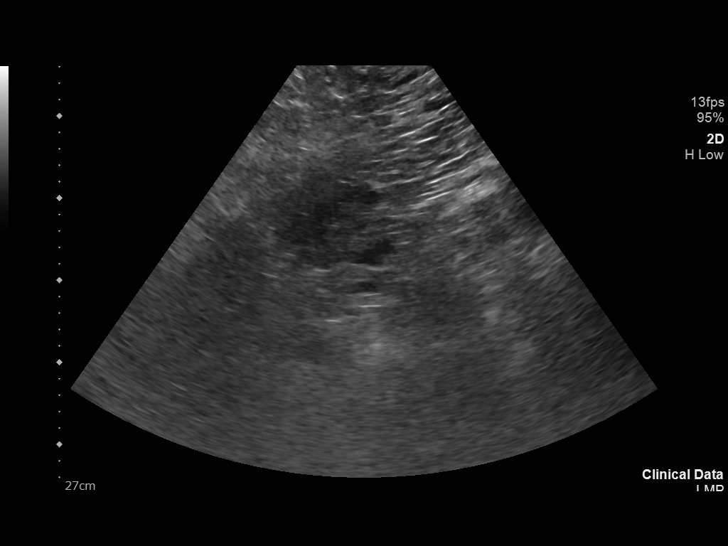
[im 25/59]
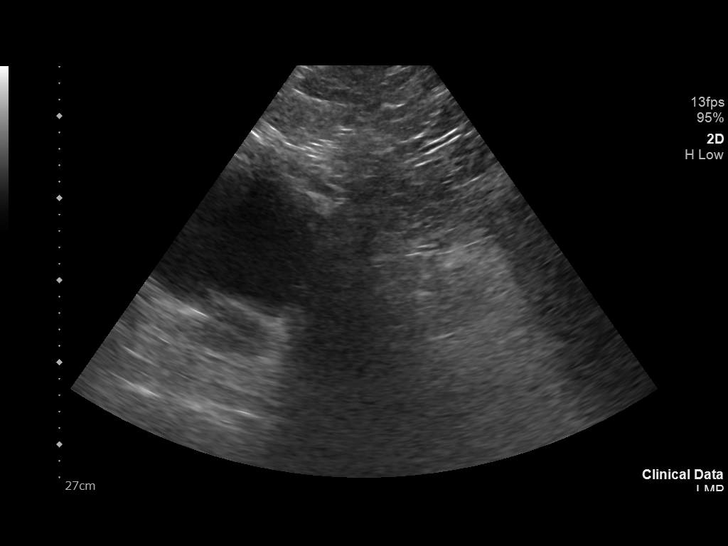
[im 30/59]
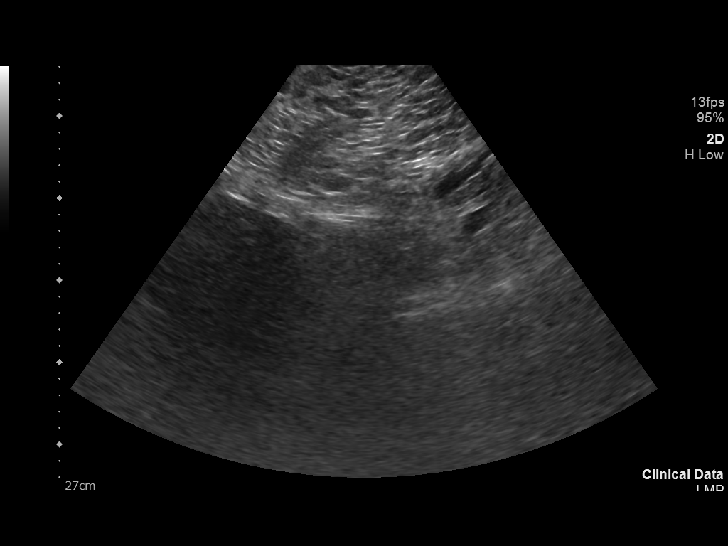
[im 34/59]
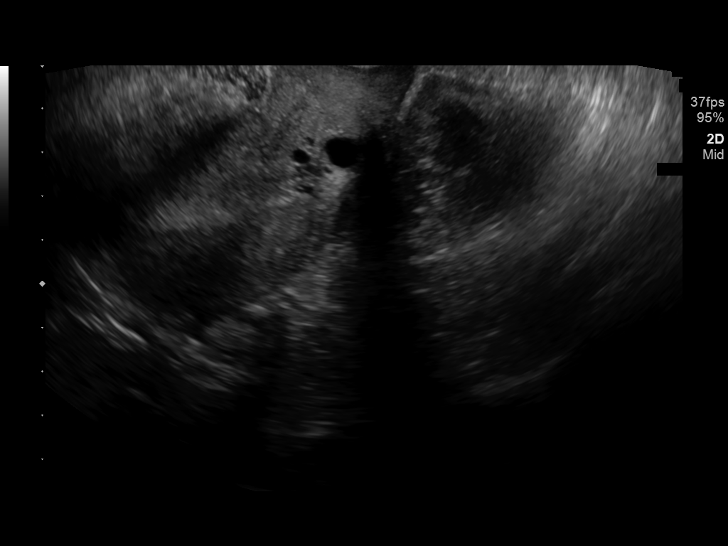
[im 39/59]
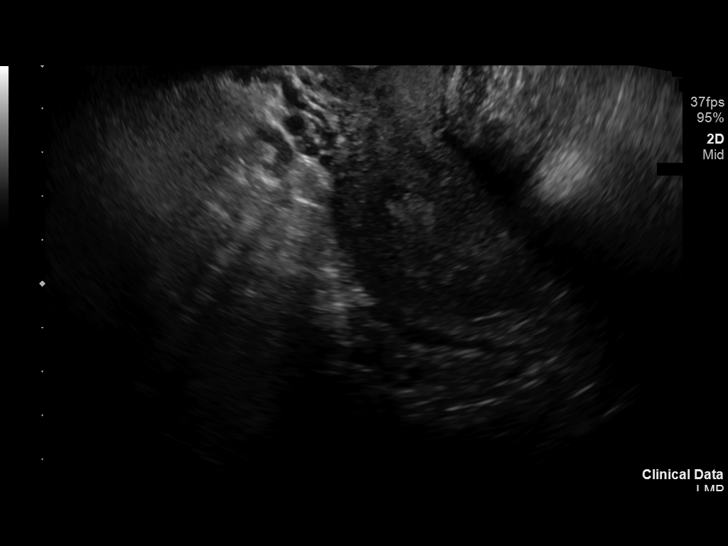
[im 44/59]
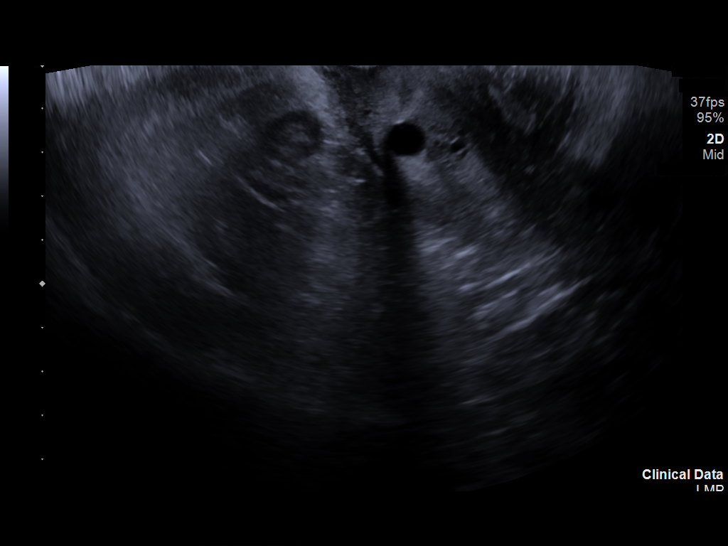
[im 49/59]
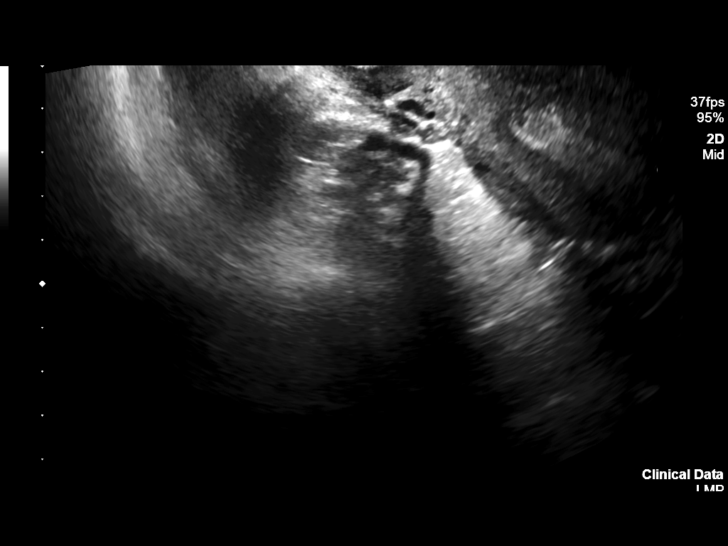
[im 54/59]
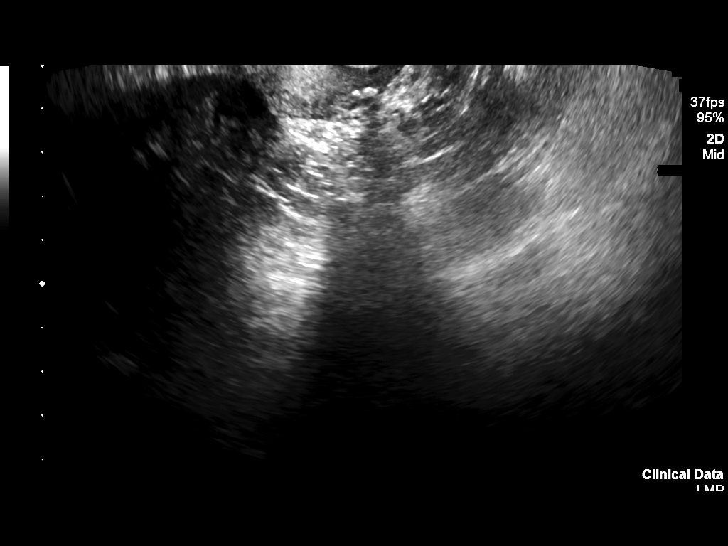
[im 59/59]
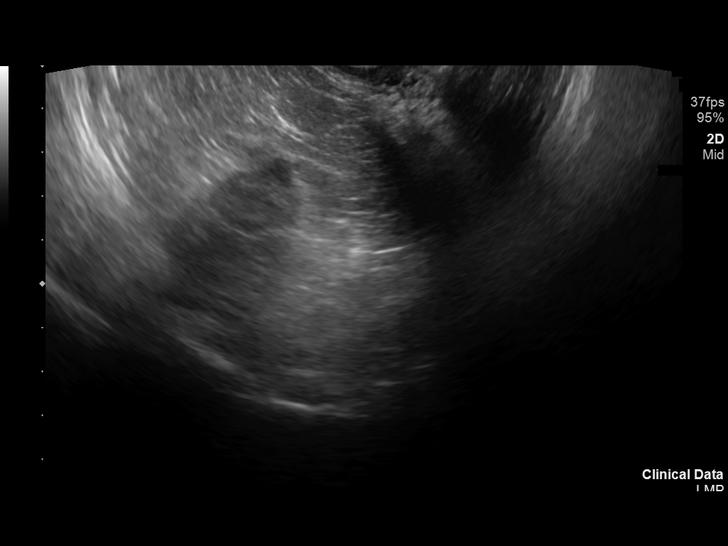

[13 of 25 positions shown; findings below may reference images not displayed]

FINDINGS: Uterus

Measurements: 8.5 x 5.4 x 5.0 cm = volume: 119 mL. No fibroids or
other mass visualized. Nabothian cyst in the cervix.

Endometrium

Thickness: 9.6 mm.  No focal abnormality visualized.

Right ovary

Not visualized and given the presence of ovarian tissue seen on
prior CT from 3337 it is likely obscured by bowel gas.

Left ovary

Not visualized and given the presence of ovarian tissue seen on
prior CT from 3337 it is likely obscured by bowel gas.

Other findings

No abnormal free fluid.
IMPRESSION: 1. Normal sonographic appearance of the uterus and endometrium.
2. Nonvisualization of the ovaries, which are likely obscured by
bowel gas.

## 2022-10-31 IMAGING — US US PELVIS COMPLETE
1 series · 13 of 25 positions shown · non-contrast
Comparison: CT abdomen pelvis May 08, 2020.

CLINICAL DATA: Pelvic pain history of irregular menses



[Series 1: us pelvis complete · 59 acquisitions, 13 frames shown]
[im 1/59]
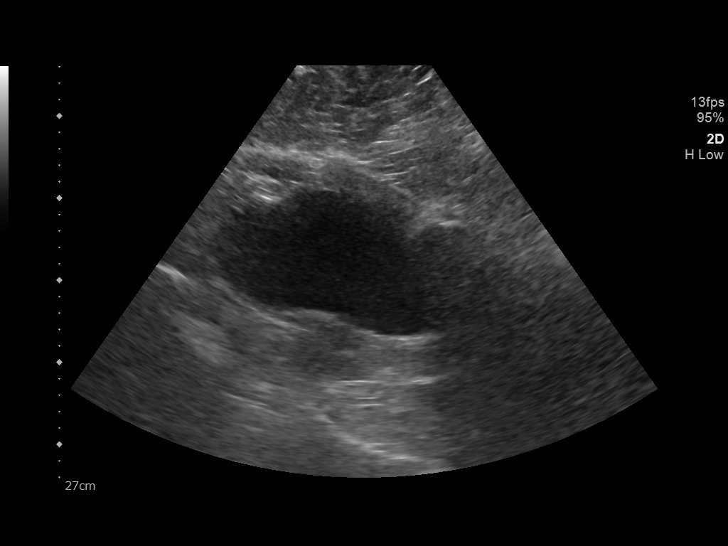
[im 5/59]
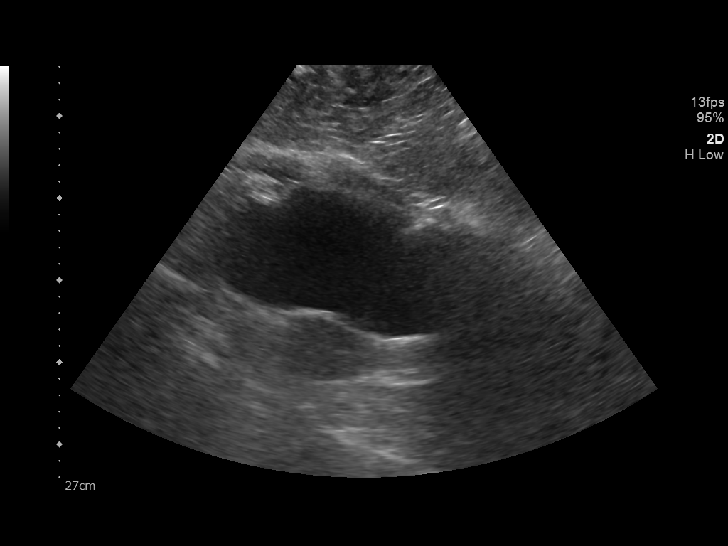
[im 10/59]
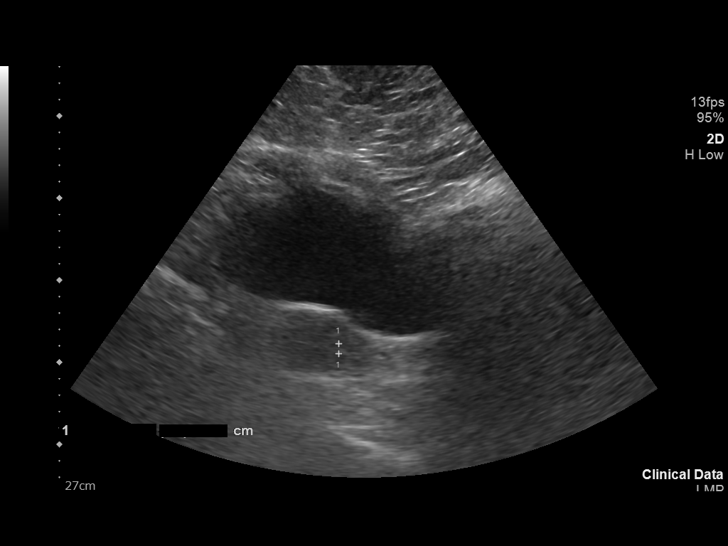
[im 15/59]
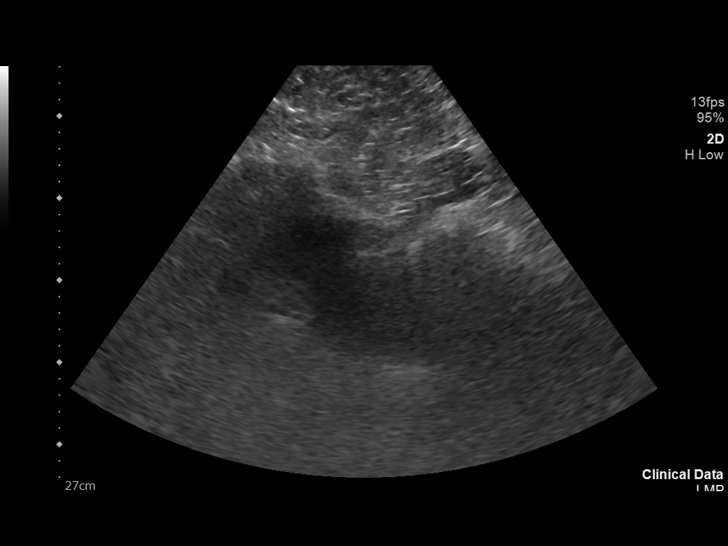
[im 20/59]
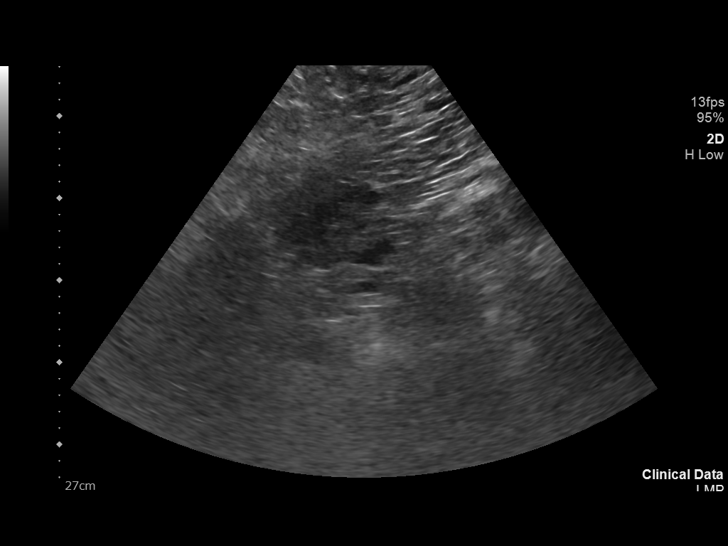
[im 25/59]
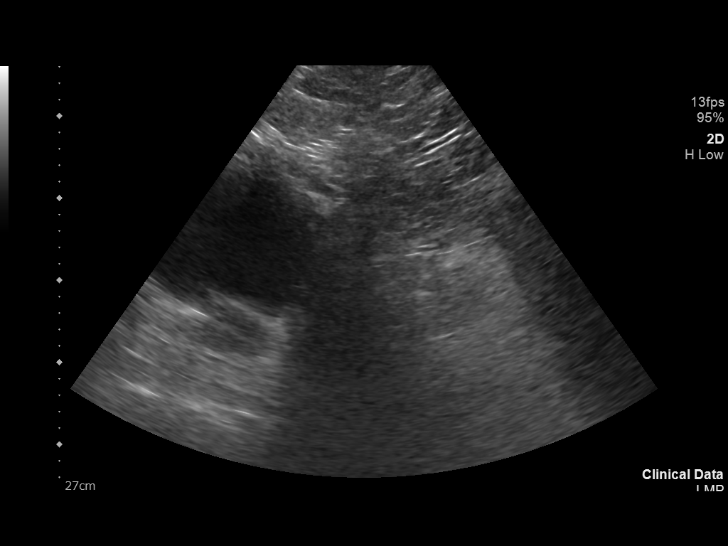
[im 30/59]
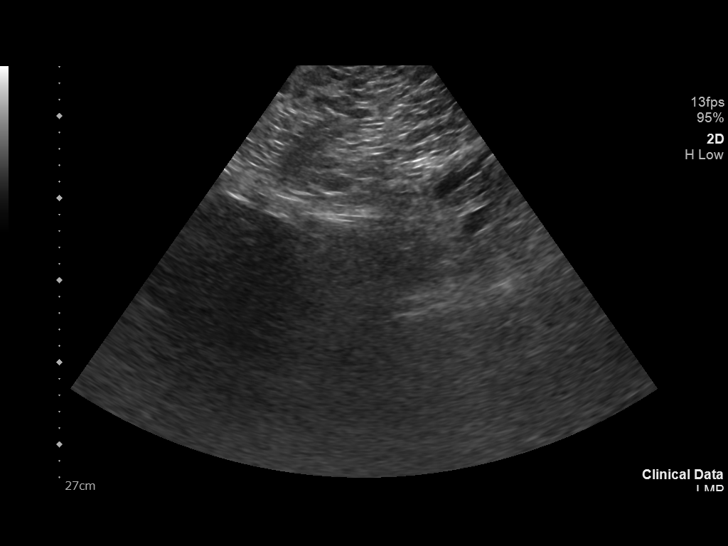
[im 34/59]
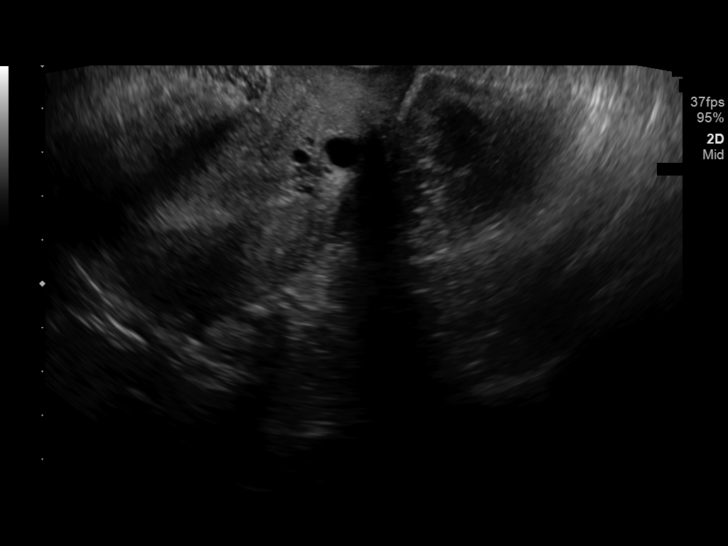
[im 39/59]
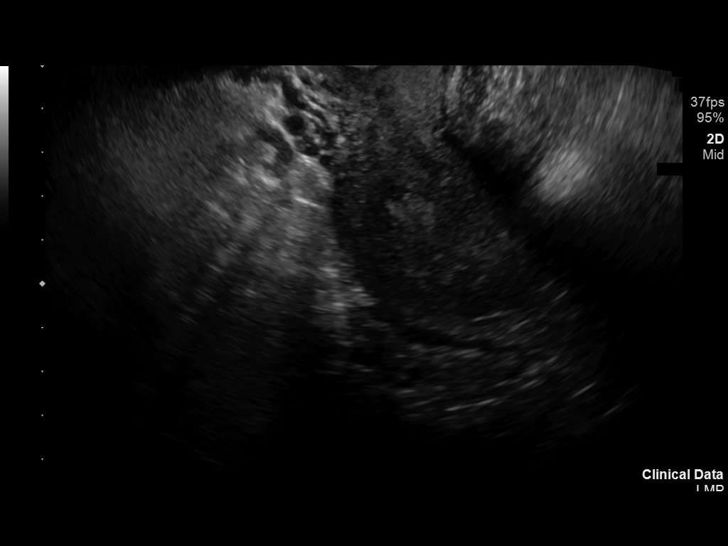
[im 44/59]
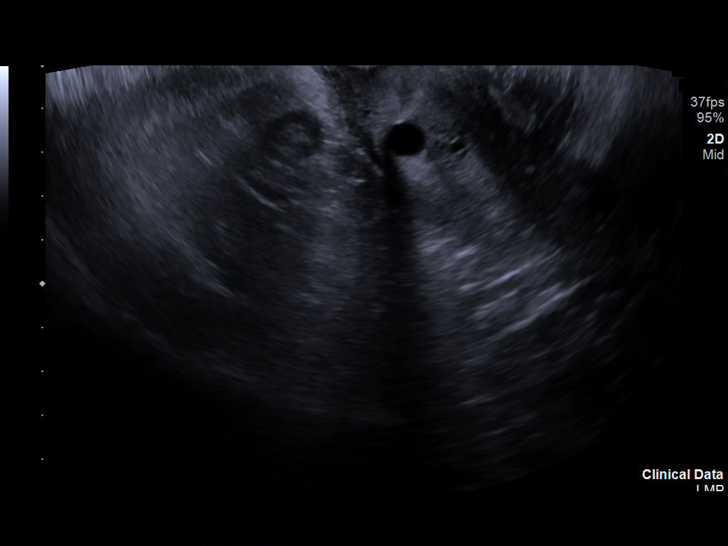
[im 49/59]
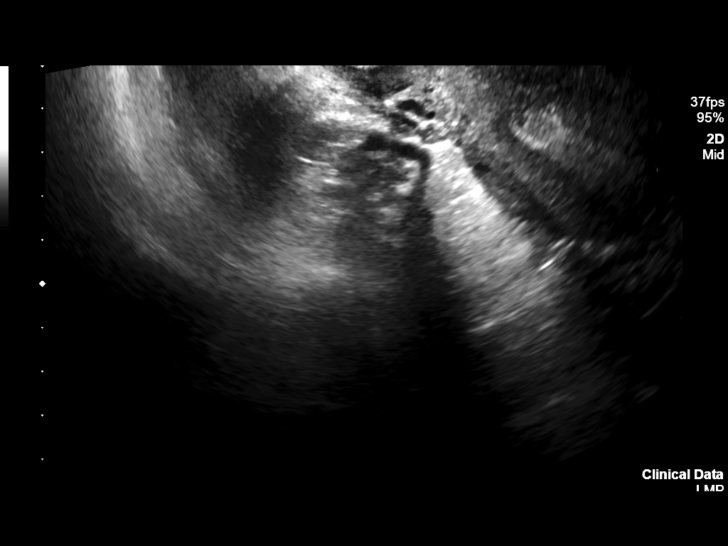
[im 54/59]
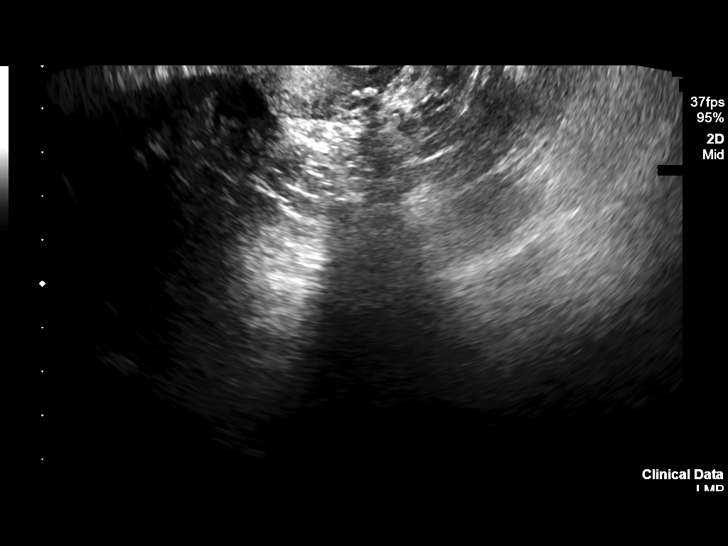
[im 59/59]
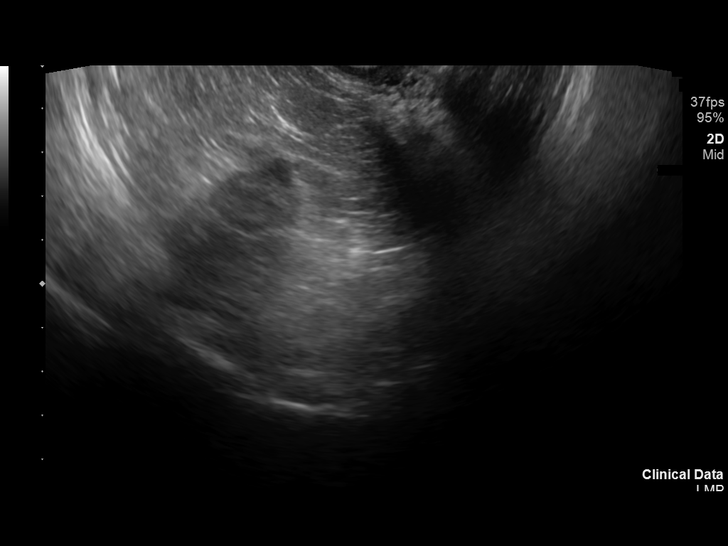

[13 of 25 positions shown; findings below may reference images not displayed]

FINDINGS: Uterus

Measurements: 8.5 x 5.4 x 5.0 cm = volume: 119 mL. No fibroids or
other mass visualized. Nabothian cyst in the cervix.

Endometrium

Thickness: 9.6 mm.  No focal abnormality visualized.

Right ovary

Not visualized and given the presence of ovarian tissue seen on
prior CT from 3337 it is likely obscured by bowel gas.

Left ovary

Not visualized and given the presence of ovarian tissue seen on
prior CT from 3337 it is likely obscured by bowel gas.

Other findings

No abnormal free fluid.
IMPRESSION: 1. Normal sonographic appearance of the uterus and endometrium.
2. Nonvisualization of the ovaries, which are likely obscured by
bowel gas.

## 2023-09-19 LAB — COLOGUARD: COLOGUARD: NEGATIVE

## 2023-09-19 LAB — EXTERNAL GENERIC LAB PROCEDURE: COLOGUARD: NEGATIVE

## 2024-02-25 ENCOUNTER — Other Ambulatory Visit: Payer: Self-pay | Admitting: Obstetrics and Gynecology

## 2024-02-25 DIAGNOSIS — Z1231 Encounter for screening mammogram for malignant neoplasm of breast: Secondary | ICD-10-CM

## 2024-05-05 ENCOUNTER — Ambulatory Visit
Admission: RE | Admit: 2024-05-05 | Discharge: 2024-05-05 | Disposition: A | Discharge status: Home / Self Care | Source: Ambulatory Visit | Attending: Obstetrics and Gynecology | Admitting: Obstetrics and Gynecology

## 2024-05-05 DIAGNOSIS — Z1231 Encounter for screening mammogram for malignant neoplasm of breast: Secondary | ICD-10-CM
